# Patient Record
Sex: Female | Born: 1965 | Race: Black or African American | Hispanic: No | Marital: Married | Smoking: Never smoker
Health system: Southern US, Community
[De-identification: ages and names within clinical notes are randomized; demographics above are authoritative.]

## PROBLEM LIST (undated history)

## (undated) DIAGNOSIS — E785 Hyperlipidemia, unspecified: Secondary | ICD-10-CM

## (undated) DIAGNOSIS — R7303 Prediabetes: Secondary | ICD-10-CM

## (undated) DIAGNOSIS — R002 Palpitations: Secondary | ICD-10-CM

## (undated) DIAGNOSIS — R569 Unspecified convulsions: Secondary | ICD-10-CM

## (undated) DIAGNOSIS — C801 Malignant (primary) neoplasm, unspecified: Secondary | ICD-10-CM

## (undated) DIAGNOSIS — I10 Essential (primary) hypertension: Secondary | ICD-10-CM

## (undated) DIAGNOSIS — M2550 Pain in unspecified joint: Secondary | ICD-10-CM

## (undated) DIAGNOSIS — R609 Edema, unspecified: Secondary | ICD-10-CM

## (undated) DIAGNOSIS — I8390 Asymptomatic varicose veins of unspecified lower extremity: Secondary | ICD-10-CM

## (undated) HISTORY — DX: Malignant (primary) neoplasm, unspecified: C80.1

## (undated) HISTORY — DX: Hyperlipidemia, unspecified: E78.5

## (undated) HISTORY — PX: HYSTEROSALPINGOGRAM: SHX6581

## (undated) HISTORY — DX: Palpitations: R00.2

## (undated) HISTORY — DX: Pain in unspecified joint: M25.50

## (undated) HISTORY — DX: Edema, unspecified: R60.9

## (undated) HISTORY — DX: Asymptomatic varicose veins of unspecified lower extremity: I83.90

## (undated) HISTORY — DX: Prediabetes: R73.03

## (undated) HISTORY — DX: Essential (primary) hypertension: I10

---
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
## 1985-11-22 DIAGNOSIS — R569 Unspecified convulsions: Secondary | ICD-10-CM

## 1985-11-22 HISTORY — DX: Unspecified convulsions: R56.9

## 2000-02-18 ENCOUNTER — Other Ambulatory Visit: Admission: RE | Admit: 2000-02-18 | Discharge: 2000-02-18 | Payer: Self-pay | Admitting: Obstetrics and Gynecology

## 2001-09-15 ENCOUNTER — Other Ambulatory Visit: Admission: RE | Admit: 2001-09-15 | Discharge: 2001-09-15 | Payer: Self-pay | Admitting: Obstetrics and Gynecology

## 2002-11-23 ENCOUNTER — Other Ambulatory Visit: Admission: RE | Admit: 2002-11-23 | Discharge: 2002-11-23 | Payer: Self-pay | Admitting: Obstetrics and Gynecology

## 2004-01-03 ENCOUNTER — Other Ambulatory Visit: Admission: RE | Admit: 2004-01-03 | Discharge: 2004-01-03 | Payer: Self-pay | Admitting: Obstetrics and Gynecology

## 2005-01-12 ENCOUNTER — Other Ambulatory Visit: Admission: RE | Admit: 2005-01-12 | Discharge: 2005-01-12 | Payer: Self-pay | Admitting: Obstetrics and Gynecology

## 2006-01-14 ENCOUNTER — Other Ambulatory Visit: Admission: RE | Admit: 2006-01-14 | Discharge: 2006-01-14 | Payer: Self-pay | Admitting: Obstetrics and Gynecology

## 2008-06-27 ENCOUNTER — Encounter: Admission: RE | Admit: 2008-06-27 | Discharge: 2008-06-27 | Payer: Self-pay | Admitting: Obstetrics and Gynecology

## 2011-03-10 ENCOUNTER — Other Ambulatory Visit: Payer: Self-pay | Admitting: Obstetrics and Gynecology

## 2012-03-10 ENCOUNTER — Other Ambulatory Visit: Payer: Self-pay | Admitting: Obstetrics and Gynecology

## 2013-03-19 ENCOUNTER — Other Ambulatory Visit: Payer: Self-pay | Admitting: Obstetrics and Gynecology

## 2013-03-19 DIAGNOSIS — R928 Other abnormal and inconclusive findings on diagnostic imaging of breast: Secondary | ICD-10-CM

## 2013-03-29 ENCOUNTER — Ambulatory Visit
Admission: RE | Admit: 2013-03-29 | Discharge: 2013-03-29 | Disposition: A | Discharge status: Home / Self Care | Payer: PRIVATE HEALTH INSURANCE | Source: Ambulatory Visit | Attending: Obstetrics and Gynecology | Admitting: Obstetrics and Gynecology

## 2013-03-29 ENCOUNTER — Other Ambulatory Visit: Payer: Self-pay | Admitting: Obstetrics and Gynecology

## 2013-03-29 DIAGNOSIS — R928 Other abnormal and inconclusive findings on diagnostic imaging of breast: Secondary | ICD-10-CM

## 2013-04-11 ENCOUNTER — Other Ambulatory Visit: Payer: Self-pay | Admitting: Obstetrics and Gynecology

## 2013-04-11 ENCOUNTER — Ambulatory Visit
Admission: RE | Admit: 2013-04-11 | Discharge: 2013-04-11 | Disposition: A | Discharge status: Home / Self Care | Payer: PRIVATE HEALTH INSURANCE | Source: Ambulatory Visit | Attending: Obstetrics and Gynecology | Admitting: Obstetrics and Gynecology

## 2013-04-11 DIAGNOSIS — R928 Other abnormal and inconclusive findings on diagnostic imaging of breast: Secondary | ICD-10-CM

## 2013-04-11 HISTORY — PX: BREAST BIOPSY: SHX20

## 2015-08-06 HISTORY — PX: COLONOSCOPY: SHX174

## 2015-12-23 ENCOUNTER — Telehealth: Payer: Self-pay | Admitting: Vascular Surgery

## 2015-12-23 NOTE — Telephone Encounter (Signed)
Received message from Lindsey Burnett 09-02-1966 to call her at phone # [phone].   When I called the above number a gentleman answered stating she wasn't home. i asked for a 2ndary # and was given [phone]. I left her a message at this # to call Hinton Dyer at [phone] so that I could assist her. She is requesting an appointment with [facility].  Hinton Dyer

## 2016-01-26 ENCOUNTER — Encounter: Payer: Self-pay | Admitting: Vascular Surgery

## 2016-01-28 ENCOUNTER — Other Ambulatory Visit: Payer: Self-pay | Admitting: *Deleted

## 2016-01-28 DIAGNOSIS — I83811 Varicose veins of right lower extremities with pain: Secondary | ICD-10-CM

## 2016-01-29 ENCOUNTER — Encounter: Payer: Self-pay | Admitting: Vascular Surgery

## 2016-01-29 ENCOUNTER — Ambulatory Visit (HOSPITAL_COMMUNITY)
Admission: RE | Admit: 2016-01-29 | Discharge: 2016-01-29 | Disposition: A | Discharge status: Home / Self Care | Payer: BLUE CROSS/BLUE SHIELD | Source: Ambulatory Visit | Attending: Vascular Surgery | Admitting: Vascular Surgery

## 2016-01-29 ENCOUNTER — Ambulatory Visit (INDEPENDENT_AMBULATORY_CARE_PROVIDER_SITE_OTHER): Payer: BLUE CROSS/BLUE SHIELD | Admitting: Vascular Surgery

## 2016-01-29 VITALS — BP 147/76 | HR 76 | Temp 98.0°F | Resp 14 | Ht 66.5 in | Wt 204.0 lb

## 2016-01-29 DIAGNOSIS — R609 Edema, unspecified: Secondary | ICD-10-CM | POA: Diagnosis present

## 2016-01-29 DIAGNOSIS — I83811 Varicose veins of right lower extremities with pain: Secondary | ICD-10-CM
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
## 2016-01-29 NOTE — Progress Notes (Signed)
Filed Vitals:   01/29/16 1518 01/29/16 1521  BP: 141/87 147/76  Pulse: 77 76  Temp: 98 F (36.7 C)   Resp: 14   Height: 5' 6.5" (1.689 m)   Weight: 204 lb (92.534 kg)   SpO2: 100%

## 2016-04-29 ENCOUNTER — Encounter: Payer: Self-pay | Admitting: Vascular Surgery

## 2016-05-04 ENCOUNTER — Encounter: Payer: Self-pay | Admitting: Vascular Surgery

## 2016-05-04 ENCOUNTER — Ambulatory Visit (INDEPENDENT_AMBULATORY_CARE_PROVIDER_SITE_OTHER): Payer: BLUE CROSS/BLUE SHIELD | Admitting: Vascular Surgery

## 2016-05-04 VITALS — BP 156/84 | HR 69 | Temp 98.7°F | Resp 18 | Ht 66.5 in | Wt 205.8 lb

## 2016-05-04 DIAGNOSIS — I83819 Varicose veins of unspecified lower extremities with pain: Secondary | ICD-10-CM | POA: Insufficient documentation

## 2016-05-04 DIAGNOSIS — I83811 Varicose veins of right lower extremities with pain: Secondary | ICD-10-CM

## 2016-05-04 NOTE — Progress Notes (Signed)
Problems with Activities of Daily Living Secondary to Leg Pain  1. 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    2. 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       Failure of  Conservative Therapy:  1. Worn 20-30 mm Hg thigh high compression hose >3 months with no relief of symptoms.  2. Frequently elevates legs-no relief of symptoms  3. Taken Ibuprofen 600 Mg TID with no relief of symptoms.                                         Vascular and Vein Specialist of [facility]  Patient name: Lindsey Burnett MRN: [id number] DOB: [birth year] Sex: female  REASON FOR VISIT: Follow-up right leg painful venous varicosities  HPI: Lindsey Burnett is a 50 y.o. female here today for follow-up. She did see Dr. Oneida Alar in early March. She has been compliant with her compression and elevation. She continues to have a discomfort despite this. She has pain specifically over the large varicosities in her medial thigh and medial calf and also achy sensation after prolonged standing I did review her prior venous duplex. This shows no evidence of significant deep vein reflux. Does have reflux throughout her great saphenous vein with an enlarged diameter. Large varicosities arising from this  Past Medical History  Diagnosis Date  . Varicose veins     Family History  Problem Relation Age of Onset  . Stroke Mother   . Stroke Father   . Cancer Father     lung  . Diabetes Father   . Hypertension Father     SOCIAL HISTORY: Social History  Substance Use Topics  . Smoking status: Never Smoker   . Smokeless tobacco: Not on file  . Alcohol Use: Not on file    No Known Allergies  Current Outpatient Prescriptions  Medication Sig Dispense Refill  . aspirin 81 MG tablet Take 81 mg by mouth daily.    Mariane Baumgarten Calcium (STOOL SOFTENER PO) Take by mouth daily.    . Prenatal  Vit-Fe Fumarate-FA (PRENATAL VITAMIN PO) Take by mouth daily.    Marland Kitchen BLACK COHOSH PO Take by mouth daily. Reported on 05/04/2016    . IRON PO Take by mouth. Reported on 05/04/2016     No current facility-administered medications for this visit.    REVIEW OF SYSTEMS:  [X]  denotes positive finding, [ ]  denotes negative finding Cardiac  Comments:  Chest pain or chest pressure:    Shortness of breath upon exertion:    Short of breath when lying flat:    Irregular heart rhythm:        Vascular    Pain in calf, thigh, or hip brought on by ambulation:    Pain in feet at night that wakes you up from your sleep:     Blood clot in your veins:    Leg swelling:         Pulmonary    Oxygen at home:    Productive cough:     Wheezing:         Neurologic    Sudden weakness in arms or legs:     Sudden numbness in arms or legs:     Sudden onset  of difficulty speaking or slurred speech:    Temporary loss of vision in one eye:     Problems with dizziness:         Gastrointestinal    Blood in stool:     Vomited blood:         Genitourinary    Burning when urinating:     Blood in urine:        Psychiatric    Major depression:         Hematologic    Bleeding problems:    Problems with blood clotting too easily:        Skin    Rashes or ulcers:        Constitutional    Fever or chills:      PHYSICAL EXAM: Filed Vitals:   05/04/16 1523 05/04/16 1531  BP: 155/84 156/84  Pulse: 69   Temp: 98.7 F (37.1 C)   TempSrc: Oral   Resp: 18   Height: 5' 6.5" (1.689 m)   Weight: 205 lb 12.8 oz (93.35 kg)   SpO2: 100%     GENERAL: The patient is a well-nourished female, in no acute distress. The vital signs are documented above. VASCULAR: 2+ dorsalis pedis pulses bilaterally PULMONARY: There is good air exchange  MUSCULOSKELETAL: There are no major deformities or cyanosis. NEUROLOGIC: No focal weakness or paresthesias are detected. SKIN: There are no ulcers or rashes  noted. PSYCHIATRIC: The patient has a normal affect. Markedly dilated varicosities throughout her medial thigh and medial calf   MEDICAL ISSUES: Healed conservative treatment painful varicosities right leg. I have discussed the option of laser ablation of her great saphenous vein and stab phlebectomy of large tributary varicosities. Explain the outpatient procedure under local. Explain the very slight risk of DVT associated with this. She understands and wished to proceed as soon as possible    Rosetta Posner, MD FACS Vascular and Vein Specialists of [facility] Tel [phone] Pager [phone]

## 2016-05-06 ENCOUNTER — Other Ambulatory Visit: Payer: Self-pay | Admitting: *Deleted

## 2016-05-06 DIAGNOSIS — I83811 Varicose veins of right lower extremities with pain: Secondary | ICD-10-CM

## 2016-06-04 ENCOUNTER — Encounter: Payer: Self-pay | Admitting: Vascular Surgery

## 2016-06-10 ENCOUNTER — Encounter: Payer: Self-pay | Admitting: Vascular Surgery

## 2016-06-10 ENCOUNTER — Ambulatory Visit (INDEPENDENT_AMBULATORY_CARE_PROVIDER_SITE_OTHER): Payer: BLUE CROSS/BLUE SHIELD | Admitting: Vascular Surgery

## 2016-06-10 VITALS — BP 150/94 | HR 68 | Temp 98.1°F | Resp 18 | Ht 66.5 in | Wt 205.0 lb

## 2016-06-10 DIAGNOSIS — I83811 Varicose veins of right lower extremities with pain: Secondary | ICD-10-CM

## 2016-06-10 HISTORY — PX: ENDOVENOUS ABLATION SAPHENOUS VEIN W/ LASER: SUR449

## 2016-06-10 NOTE — Progress Notes (Signed)
     Laser Ablation Procedure    Date: 06/10/2016   [place] DOB:06-Nov-1966  Consent signed: Yes    Surgeon:  Dr. Sherren Mocha Xavian Hardcastle  Procedure: Laser Ablation: right Greater Saphenous Vein  BP 150/94 mmHg  Pulse 68  Temp(Src) 98.1 F (36.7 C) (Oral)  Resp 18  Ht 5' 6.5" (1.689 m)  Wt 205 lb (92.987 kg)  BMI 32.60 kg/m2  SpO2 100%  Tumescent Anesthesia: 475 cc 0.9% NaCl with 50 cc Lidocaine HCL with 1% Epi and 15 cc 8.4% NaHCO3  Local Anesthesia: 1 cc Lidocaine HCL and NaHCO3 (ratio 2:1)  15 watts continuous mode        Total energy: 1338 Joules   Total time: 1:29    Stab Phlebectomy: 10-20 Sites: Thigh and Calf  Right leg  Patient tolerated procedure well Description of Procedure:  After marking the course of the secondary varicosities, the patient was placed on the operating table in the supine position, and the right leg was prepped and draped in sterile fashion.   Local anesthetic was administered and under ultrasound guidance the saphenous vein was accessed with a micro needle and guide wire; then the mirco puncture sheath was placed.  A guide wire was inserted saphenofemoral junction , followed by a 5 french sheath.  The position of the sheath and then the laser fiber below the junction was confirmed using the ultrasound.  Tumescent anesthesia was administered along the course of the saphenous vein using ultrasound guidance. The patient was placed in Trendelenburg position and protective laser glasses were placed on patient and staff, and the laser was fired at 15 watts continuous mode advancing 1-67mm/second for a total of 1338 joules.   For stab phlebectomies, local anesthetic was administered at the previously marked varicosities, and tumescent anesthesia was administered around the vessels.  Ten to 20 stab wounds were made using the tip of an 11 blade. And using the vein hook, the phlebectomies were performed using a hemostat to avulse the varicosities.  Adequate hemostasis was  achieved.     Steri strips were applied to the stab wounds and ABD pads and thigh high compression stockings were applied.  Ace wrap bandages were applied over the phlebectomy sites and at the top of the saphenofemoral junction. Blood loss was less than 15 cc.  The patient ambulated out of the operating room having tolerated the procedure well.  Uneventful ablation from mid calf to mid thigh. She had reflux in her great saphenous vein at this level. Also had phlebectomy of large varicosities in her medial calf and medial thigh. The medial thigh varicosities arose off the short segment of anterior accessory branch in her proximal thigh. These varicosities these were successfully treated with stab phlebectomy. Will follow-up in one week

## 2016-06-10 NOTE — Progress Notes (Signed)
Filed Vitals:   06/10/16 0830 06/10/16 0834  BP: 154/89 150/94  Pulse: 68   Temp: 98.1 F (36.7 C)   TempSrc: Oral   Resp: 18   Height: 5' 6.5" (1.689 m)   Weight: 205 lb (92.987 kg)   SpO2: 100%

## 2016-06-11 ENCOUNTER — Telehealth: Payer: Self-pay | Admitting: *Deleted

## 2016-06-11 NOTE — Telephone Encounter (Signed)
    06/11/2016  Time: 10:58 AM   Patient Name: Lindsey Burnett  Patient of: T.F. Early  Procedure:Laser Ablation and stab phlebectomy 10-20 incisions right leg  right  06-10-2016    Reached patient at home and checked  Her status  Yes    Comments/Actions Taken: Reviewed all post procedural instructions with [name] and reminded her of post LA duplex and VV follow up appointment on 06-17-2016.  [name] states she has had no heavy bleeding and no breakthrough bleeding through the compression dressing.  [name] states no right thigh pain but states she had mild discomfort "behind my right knee" and "slight swelling of my right ankle."  Advised [name] to unwrap Ace bandage and smooth out compression hose behind her right knee and re wrap with Ace bandage.  Advised her to keep right leg elevated and call [facility]VS if swelling increased in right ankle or if she experienced right foot and ankle swelling and pain or if she had difficulty putting on right shoe.  [name] verbalized understanding.       @SIGNATURE @

## 2016-06-16 ENCOUNTER — Encounter: Payer: Self-pay | Admitting: Vascular Surgery

## 2016-06-17 ENCOUNTER — Ambulatory Visit (INDEPENDENT_AMBULATORY_CARE_PROVIDER_SITE_OTHER): Payer: Self-pay | Admitting: Vascular Surgery

## 2016-06-17 ENCOUNTER — Encounter: Payer: Self-pay | Admitting: Vascular Surgery

## 2016-06-17 ENCOUNTER — Ambulatory Visit (HOSPITAL_COMMUNITY)
Admission: RE | Admit: 2016-06-17 | Discharge: 2016-06-17 | Disposition: A | Discharge status: Home / Self Care | Payer: BLUE CROSS/BLUE SHIELD | Source: Ambulatory Visit | Attending: Vascular Surgery | Admitting: Vascular Surgery

## 2016-06-17 VITALS — BP 163/91 | HR 72 | Temp 97.6°F | Resp 16 | Ht 66.5 in | Wt 204.0 lb

## 2016-06-17 DIAGNOSIS — Z9889 Other specified postprocedural states: Secondary | ICD-10-CM | POA: Insufficient documentation

## 2016-06-17 DIAGNOSIS — I83811 Varicose veins of right lower extremities with pain: Secondary | ICD-10-CM | POA: Insufficient documentation

## 2016-06-17 NOTE — Progress Notes (Signed)
Vitals:   06/17/16 0909  BP: (!) 151/87  Pulse: 72  Resp: 16  Temp: 97.6 F (36.4 C)  SpO2: 100%  Weight: 204 lb (92.5 kg)  Height: 5' 6.5" (1.689 m)                                       Patient name: Lindsey Burnett MRN: [id number] DOB: 1966-05-23 Sex: female  REASON FOR VISIT: One-week follow-up after right laser ablation of great saphenous vein and stab phlebectomy of tributary varicosities  HPI: Lindsey Burnett is a 50 y.o. female here today for follow-up. She had minimal discomfort associated with procedure and has been compliant with her compression garments.  Current Outpatient Prescriptions  Medication Sig Dispense Refill  . aspirin 81 MG tablet Take 81 mg by mouth daily. Reported on 06/10/2016    . Docusate Calcium (STOOL SOFTENER PO) Take by mouth daily.    . Prenatal Vit-Fe Fumarate-FA (PRENATAL VITAMIN PO) Take by mouth daily.    Marland Kitchen BLACK COHOSH PO Take by mouth daily. Reported on 06/10/2016    . IRON PO Take by mouth. Reported on 06/10/2016     No current facility-administered medications for this visit.      PHYSICAL EXAM: Vitals:   06/17/16 0909 06/17/16 0914  BP: (!) 151/87 (!) 163/91  Pulse: 72 72  Resp: 16   Temp: 97.6 F (36.4 C)   SpO2: 100%   Weight: 204 lb (92.5 kg)   Height: 5' 6.5" (1.689 m)     GENERAL: The patient is a well-nourished female, in no acute distress. The vital signs are documented above. Healing stab phlebectomy sites with typical thickening in the ablation site. No skin irritation.  Right leg duplex shows closure of her saphenous vein from proximal calf to mid thigh where the saphenous vein was atretic proximal to this. No evidence of DVT  MEDICAL ISSUES: Excellent early result from laser ablation and stab phlebectomy of painful right leg venous hypertension. We'll continue her compression garments one additional week. Will see [place] on an as-needed basis   Rosetta Posner, MD [facility] Vascular and Vein Specialists of [facility] Tel [phone] Pager [phone]

## 2016-06-30 DIAGNOSIS — Z1231 Encounter for screening mammogram for malignant neoplasm of breast: Secondary | ICD-10-CM | POA: Diagnosis not present

## 2016-06-30 DIAGNOSIS — Z6831 Body mass index (BMI) 31.0-31.9, adult: Secondary | ICD-10-CM | POA: Diagnosis not present

## 2016-06-30 DIAGNOSIS — Z01419 Encounter for gynecological examination (general) (routine) without abnormal findings: Secondary | ICD-10-CM | POA: Diagnosis not present

## 2016-12-16 DIAGNOSIS — R002 Palpitations: Secondary | ICD-10-CM | POA: Diagnosis not present

## 2016-12-16 DIAGNOSIS — Z6828 Body mass index (BMI) 28.0-28.9, adult: Secondary | ICD-10-CM | POA: Diagnosis not present

## 2016-12-20 DIAGNOSIS — Z23 Encounter for immunization: Secondary | ICD-10-CM | POA: Diagnosis not present

## 2016-12-23 DIAGNOSIS — R310 Gross hematuria: Secondary | ICD-10-CM | POA: Diagnosis not present

## 2016-12-23 DIAGNOSIS — Z20828 Contact with and (suspected) exposure to other viral communicable diseases: Secondary | ICD-10-CM | POA: Diagnosis not present

## 2016-12-27 DIAGNOSIS — N390 Urinary tract infection, site not specified: Secondary | ICD-10-CM | POA: Diagnosis not present

## 2016-12-27 DIAGNOSIS — R300 Dysuria: Secondary | ICD-10-CM | POA: Diagnosis not present

## 2017-02-24 DIAGNOSIS — N390 Urinary tract infection, site not specified: Secondary | ICD-10-CM | POA: Diagnosis not present

## 2017-02-24 DIAGNOSIS — R319 Hematuria, unspecified: Secondary | ICD-10-CM | POA: Diagnosis not present

## 2017-02-24 DIAGNOSIS — Z6830 Body mass index (BMI) 30.0-30.9, adult: Secondary | ICD-10-CM | POA: Diagnosis not present

## 2017-03-03 DIAGNOSIS — R319 Hematuria, unspecified: Secondary | ICD-10-CM | POA: Diagnosis not present

## 2017-03-03 DIAGNOSIS — E785 Hyperlipidemia, unspecified: Secondary | ICD-10-CM | POA: Diagnosis not present

## 2017-03-09 DIAGNOSIS — Z6830 Body mass index (BMI) 30.0-30.9, adult: Secondary | ICD-10-CM | POA: Diagnosis not present

## 2017-03-09 DIAGNOSIS — R319 Hematuria, unspecified: Secondary | ICD-10-CM | POA: Diagnosis not present

## 2017-03-09 DIAGNOSIS — E785 Hyperlipidemia, unspecified: Secondary | ICD-10-CM | POA: Diagnosis not present

## 2017-04-15 DIAGNOSIS — N3090 Cystitis, unspecified without hematuria: Secondary | ICD-10-CM | POA: Diagnosis not present

## 2017-04-21 DIAGNOSIS — N951 Menopausal and female climacteric states: Secondary | ICD-10-CM | POA: Diagnosis not present

## 2017-04-21 DIAGNOSIS — Z6830 Body mass index (BMI) 30.0-30.9, adult: Secondary | ICD-10-CM | POA: Diagnosis not present

## 2017-07-13 DIAGNOSIS — Z124 Encounter for screening for malignant neoplasm of cervix: Secondary | ICD-10-CM | POA: Diagnosis not present

## 2017-07-13 DIAGNOSIS — Z01419 Encounter for gynecological examination (general) (routine) without abnormal findings: Secondary | ICD-10-CM | POA: Diagnosis not present

## 2017-07-13 DIAGNOSIS — Z1231 Encounter for screening mammogram for malignant neoplasm of breast: Secondary | ICD-10-CM | POA: Diagnosis not present

## 2017-07-13 DIAGNOSIS — Z6830 Body mass index (BMI) 30.0-30.9, adult: Secondary | ICD-10-CM | POA: Diagnosis not present

## 2017-08-03 DIAGNOSIS — Z6829 Body mass index (BMI) 29.0-29.9, adult: Secondary | ICD-10-CM | POA: Diagnosis not present

## 2017-08-03 DIAGNOSIS — E663 Overweight: Secondary | ICD-10-CM | POA: Diagnosis not present

## 2017-08-03 DIAGNOSIS — E785 Hyperlipidemia, unspecified: Secondary | ICD-10-CM | POA: Diagnosis not present

## 2017-08-03 DIAGNOSIS — Z23 Encounter for immunization: Secondary | ICD-10-CM | POA: Diagnosis not present

## 2017-08-17 DIAGNOSIS — Z6829 Body mass index (BMI) 29.0-29.9, adult: Secondary | ICD-10-CM | POA: Diagnosis not present

## 2017-08-17 DIAGNOSIS — R309 Painful micturition, unspecified: Secondary | ICD-10-CM | POA: Diagnosis not present

## 2017-08-25 DIAGNOSIS — N939 Abnormal uterine and vaginal bleeding, unspecified: Secondary | ICD-10-CM | POA: Diagnosis not present

## 2017-08-26 DIAGNOSIS — R339 Retention of urine, unspecified: Secondary | ICD-10-CM | POA: Diagnosis not present

## 2017-08-26 DIAGNOSIS — Z780 Asymptomatic menopausal state: Secondary | ICD-10-CM | POA: Diagnosis not present

## 2017-08-26 DIAGNOSIS — N390 Urinary tract infection, site not specified: Secondary | ICD-10-CM | POA: Diagnosis not present

## 2017-08-26 DIAGNOSIS — N813 Complete uterovaginal prolapse: Secondary | ICD-10-CM | POA: Diagnosis not present

## 2017-09-01 DIAGNOSIS — Z6830 Body mass index (BMI) 30.0-30.9, adult: Secondary | ICD-10-CM | POA: Diagnosis not present

## 2017-09-01 DIAGNOSIS — N939 Abnormal uterine and vaginal bleeding, unspecified: Secondary | ICD-10-CM | POA: Diagnosis not present

## 2018-03-17 DIAGNOSIS — R05 Cough: Secondary | ICD-10-CM | POA: Diagnosis not present

## 2018-03-17 DIAGNOSIS — J309 Allergic rhinitis, unspecified: Secondary | ICD-10-CM | POA: Diagnosis not present

## 2018-05-04 DIAGNOSIS — N950 Postmenopausal bleeding: Secondary | ICD-10-CM | POA: Diagnosis not present

## 2018-05-04 DIAGNOSIS — Z6831 Body mass index (BMI) 31.0-31.9, adult: Secondary | ICD-10-CM | POA: Diagnosis not present

## 2018-05-12 DIAGNOSIS — R300 Dysuria: Secondary | ICD-10-CM | POA: Diagnosis not present

## 2018-05-12 DIAGNOSIS — N390 Urinary tract infection, site not specified: Secondary | ICD-10-CM | POA: Diagnosis not present

## 2018-05-12 DIAGNOSIS — Z1331 Encounter for screening for depression: Secondary | ICD-10-CM | POA: Diagnosis not present

## 2018-05-12 DIAGNOSIS — E663 Overweight: Secondary | ICD-10-CM | POA: Diagnosis not present

## 2018-05-12 DIAGNOSIS — Z6829 Body mass index (BMI) 29.0-29.9, adult: Secondary | ICD-10-CM | POA: Diagnosis not present

## 2018-07-26 DIAGNOSIS — Z01419 Encounter for gynecological examination (general) (routine) without abnormal findings: Secondary | ICD-10-CM | POA: Diagnosis not present

## 2018-07-26 DIAGNOSIS — Z1231 Encounter for screening mammogram for malignant neoplasm of breast: Secondary | ICD-10-CM | POA: Diagnosis not present

## 2018-07-26 DIAGNOSIS — Z6832 Body mass index (BMI) 32.0-32.9, adult: Secondary | ICD-10-CM | POA: Diagnosis not present

## 2018-09-05 DIAGNOSIS — Z6832 Body mass index (BMI) 32.0-32.9, adult: Secondary | ICD-10-CM | POA: Diagnosis not present

## 2018-09-05 DIAGNOSIS — N814 Uterovaginal prolapse, unspecified: Secondary | ICD-10-CM | POA: Diagnosis not present

## 2018-10-02 DIAGNOSIS — E669 Obesity, unspecified: Secondary | ICD-10-CM | POA: Diagnosis not present

## 2018-10-02 DIAGNOSIS — R300 Dysuria: Secondary | ICD-10-CM | POA: Diagnosis not present

## 2018-10-02 DIAGNOSIS — N390 Urinary tract infection, site not specified: Secondary | ICD-10-CM | POA: Diagnosis not present

## 2018-10-02 DIAGNOSIS — Z6832 Body mass index (BMI) 32.0-32.9, adult: Secondary | ICD-10-CM | POA: Diagnosis not present

## 2018-10-09 DIAGNOSIS — Z6832 Body mass index (BMI) 32.0-32.9, adult: Secondary | ICD-10-CM | POA: Diagnosis not present

## 2018-10-09 DIAGNOSIS — M778 Other enthesopathies, not elsewhere classified: Secondary | ICD-10-CM | POA: Diagnosis not present

## 2018-10-09 DIAGNOSIS — E669 Obesity, unspecified: Secondary | ICD-10-CM | POA: Diagnosis not present

## 2019-08-30 ENCOUNTER — Other Ambulatory Visit: Payer: Self-pay | Admitting: Obstetrics and Gynecology

## 2019-08-30 DIAGNOSIS — N644 Mastodynia: Secondary | ICD-10-CM

## 2019-09-12 ENCOUNTER — Other Ambulatory Visit (HOSPITAL_COMMUNITY): Payer: Self-pay | Admitting: *Deleted

## 2019-09-12 DIAGNOSIS — N6320 Unspecified lump in the left breast, unspecified quadrant: Secondary | ICD-10-CM

## 2019-09-12 DIAGNOSIS — N644 Mastodynia: Secondary | ICD-10-CM

## 2019-09-18 ENCOUNTER — Ambulatory Visit (HOSPITAL_COMMUNITY)
Admission: RE | Admit: 2019-09-18 | Discharge: 2019-09-18 | Disposition: A | Discharge status: Home / Self Care | Payer: Self-pay | Source: Ambulatory Visit | Attending: Obstetrics and Gynecology | Admitting: Obstetrics and Gynecology

## 2019-09-18 ENCOUNTER — Encounter (HOSPITAL_COMMUNITY): Payer: Self-pay

## 2019-09-18 ENCOUNTER — Ambulatory Visit
Admission: RE | Admit: 2019-09-18 | Discharge: 2019-09-18 | Disposition: A | Discharge status: Home / Self Care | Payer: BLUE CROSS/BLUE SHIELD | Source: Ambulatory Visit | Attending: Obstetrics and Gynecology | Admitting: Obstetrics and Gynecology

## 2019-09-18 ENCOUNTER — Ambulatory Visit
Admission: RE | Admit: 2019-09-18 | Discharge: 2019-09-18 | Disposition: A | Discharge status: Home / Self Care | Payer: No Typology Code available for payment source | Source: Ambulatory Visit | Attending: Obstetrics and Gynecology | Admitting: Obstetrics and Gynecology

## 2019-09-18 ENCOUNTER — Other Ambulatory Visit: Payer: Self-pay

## 2019-09-18 DIAGNOSIS — N644 Mastodynia: Secondary | ICD-10-CM

## 2019-09-18 DIAGNOSIS — N6320 Unspecified lump in the left breast, unspecified quadrant: Secondary | ICD-10-CM

## 2019-09-18 DIAGNOSIS — Z1239 Encounter for other screening for malignant neoplasm of breast: Secondary | ICD-10-CM

## 2019-09-18 NOTE — Progress Notes (Signed)
Complaints of left outer and axillary breast pain x 4 months that comes and goes. Patient rates the pain at a 6 out of 10. Patient stated she was having right outer breast and axillary pain that resolved two weeks ago.  Pap Smear: Pap smear not completed today. Last Pap smear was in September 2019 at [facility] and normal per patient. Per patient has no history of an abnormal Pap smear. No Pap smear results are in Epic.  Physical exam: Breasts Breasts symmetrical. No skin abnormalities bilateral breasts. No nipple retraction bilateral breasts. No nipple discharge bilateral breasts. No lymphadenopathy. No lumps palpated bilateral breasts. Complaints of bilateral outer breast pain on exam. Referred patient to the [facility] for a diagnostic mammogram. Appointment scheduled for Tuesday, September 18, 2019 at 1450.        Pelvic/Bimanual No Pap smear completed today since last Pap smear was in September 2019 per patient. Pap smear not indicated per BCCCP guidelines.   Smoking History: Patient has never smoked.  Patient Navigation: Patient education provided. Access to services provided for patient through [facility] program.   Colorectal Cancer Screening: Per patient had a colonoscopy completed 6 years ago. No complaints today.   Breast and Cervical Cancer Risk Assessment: Patient has a family history of a sister and paternal aunt having breast cancer. Patient has no known genetic mutations or history of radiation treatment to the chest before age 15. Patient has no history of cervical dysplasia, immunocompromised, or DES exposure in-utero.  Risk Assessment    Risk Scores      09/18/2019   Last edited by: Loletta Parish, RN   5-year risk: 2.2 %   Lifetime risk: 12.2 %

## 2019-09-18 NOTE — Patient Instructions (Signed)
Explained breast self awareness Lindsey Burnett. Patient did not need a Pap smear today due to last Pap smear was in September 2019 per patient. Let her know BCCCP will cover Pap smears every 3 years unless has a history of abnormal Pap smears. Referred patient to the [facility] for a diagnostic mammogram. Appointment scheduled for Tuesday, September 18, 2019 at 1450. Patient aware of appointment and will be there. [name] verbalized understanding.  Mujahid Jalomo, Arvil Chaco, RN 9:52 AM

## 2019-10-10 ENCOUNTER — Ambulatory Visit: Payer: Self-pay

## 2019-10-17 ENCOUNTER — Inpatient Hospital Stay: Payer: Self-pay | Attending: Obstetrics and Gynecology | Admitting: *Deleted

## 2019-10-17 ENCOUNTER — Other Ambulatory Visit: Payer: Self-pay | Admitting: Obstetrics and Gynecology

## 2019-10-17 ENCOUNTER — Other Ambulatory Visit: Payer: Self-pay

## 2019-10-17 VITALS — BP 159/94 | Temp 97.3°F | Ht 67.5 in | Wt 208.0 lb

## 2019-10-17 DIAGNOSIS — Z0000 Encounter for general adult medical examination without abnormal findings: Secondary | ICD-10-CM

## 2019-10-17 NOTE — Progress Notes (Signed)
[facility] initial screening   Clinical Measurement:  Height: 67.5 in Weight: 208 lb  Blood Pressure: 172/99  Blood Pressure #2: 159/94 Fasting Labs Drawn Today, will review with patient when they result.   Medical History:  Patient states that she does not have a history of high cholesterol, high blood pressure or diabetes.  Medications:  Patient states that she does not take medication to lower cholesterol, blood pressure or blood sugar.  Patient does not take an aspirin a day to help prevent a heart attack or stroke.    Blood pressure, self measurement: Patient states that she does not measure blood pressure from home and has not been told to do so by a healthcare provider.   Nutrition: Patient states that on average she eats 1cups of fruit and 0 cups of vegetables per day. Patient states that she does not eat fish at least 2 times per week. Patient eats less than half servings of whole grains. Patient does not drink less than 36 ounces of beverages with added sugar weekly. Patient is currently watching sodium or salt intake. In the past 7 days patient has not had any drinks containing alcohol. On average patient does not drink any drinks containing alcohol.      Physical activity:  Patient states that she gets 0 minutes of moderate and 0 minutes of vigorous physical activity each week.  Smoking status:  Patient states that she has never smoked tobacco.   Quality of life:  Over the past 2 weeks patient states that she has not had any days where she has little interest or pleasure in doing things and 0 days where she has felt down, depressed or hopeless.    Risk reduction and counseling:    Health Coaching: Spoke with patient about add more fruits and vegetables in diet. Gave suggestions for heart healthy fish that patient can try like salmon, tuna or mackerel. Gave ideas for different whole grains that patient can incorporate into diet like brown rice, whole wheat pasta, whole wheat bread,  oatmeal and whole grain cereals. Encouraged patient to reduce the amount of beverages with added sugars that is consumed. Patient currently drinks around 2 sodas a week and 12 cups of juice. Encouraged patient to look for juices with reduced sugar or no sugar added. Encouraged patient to continue watching the amount of salt that she consumes since her BP has been elevated recently. Also encouraged patient to try and start exercising daily if possible. Encouraged patient to start out walking a for a few minutes every day and increasing the time until she gets to at least 20 minutes of walking daily.    Navigation:  I will notify patient of lab results.  Patient is aware of 2 more health coaching sessions and a follow up. Will call patient with follow-up appointment information for Internal Medicine once appointment has been scheduled.   Time: 20 minutes

## 2019-10-18 LAB — LIPID PANEL W/O CHOL/HDL RATIO
Cholesterol, Total: 171 mg/dL (ref 100–199)
HDL: 52 mg/dL (ref >39)
LDL Chol Calc (NIH): 110 mg/dL — ABNORMAL HIGH (ref 0–99)
Triglycerides: 42 mg/dL (ref 0–149)
VLDL Cholesterol Cal: 9 mg/dL (ref 5–40)

## 2019-10-18 LAB — HGB A1C W/O EAG: Hgb A1c MFr Bld: 5.4 % (ref 4.8–5.6)

## 2019-10-18 LAB — GLUCOSE, RANDOM: Glucose: 92 mg/dL (ref 65–99)

## 2019-10-22 ENCOUNTER — Telehealth: Payer: Self-pay

## 2019-10-22 NOTE — Telephone Encounter (Signed)
Health coaching 2     Labs-171 cholesterol , 110 LDL cholesterol , 42 triglycerides , 52 HDL cholesterol , 5.4 hemoglobin A1C , 92 mean plasma glucose  Patient understands and is aware of her lab results.   Goals-  Discussed lab results with patient and answered any questions patient had regarding results.  Reduce the amount of fried and fatty foods consumed. Reduce the amount of red meat and full fat dairy products. Add in more whole grains and heart healthy fish. Add more fruits and vegetables in diet. Start exercising for at least 20 minutes a day.   Navigation:  Patient is aware of 1 more health coaching sessions and a follow up. Will call patient with follow-up appointment information once appointment is scheduled with Internal Medicine for elevated BP.  Time- 10 minutes

## 2019-11-01 ENCOUNTER — Other Ambulatory Visit: Payer: Self-pay

## 2019-11-01 ENCOUNTER — Ambulatory Visit (HOSPITAL_COMMUNITY)
Admission: RE | Admit: 2019-11-01 | Discharge: 2019-11-01 | Disposition: A | Discharge status: Home / Self Care | Payer: PRIVATE HEALTH INSURANCE | Source: Ambulatory Visit | Attending: Internal Medicine | Admitting: Internal Medicine

## 2019-11-01 ENCOUNTER — Ambulatory Visit (INDEPENDENT_AMBULATORY_CARE_PROVIDER_SITE_OTHER): Payer: Self-pay | Admitting: Internal Medicine

## 2019-11-01 VITALS — BP 156/97 | HR 75 | Temp 98.4°F | Ht 67.5 in | Wt 210.7 lb

## 2019-11-01 DIAGNOSIS — Z0000 Encounter for general adult medical examination without abnormal findings: Secondary | ICD-10-CM | POA: Insufficient documentation

## 2019-11-01 DIAGNOSIS — R002 Palpitations: Secondary | ICD-10-CM

## 2019-11-01 DIAGNOSIS — I10 Essential (primary) hypertension: Secondary | ICD-10-CM | POA: Insufficient documentation

## 2019-11-01 DIAGNOSIS — Z23 Encounter for immunization: Secondary | ICD-10-CM

## 2019-11-01 DIAGNOSIS — I8390 Asymptomatic varicose veins of unspecified lower extremity: Secondary | ICD-10-CM

## 2019-11-01 DIAGNOSIS — G40909 Epilepsy, unspecified, not intractable, without status epilepticus: Secondary | ICD-10-CM

## 2019-11-01 MED ORDER — AMLODIPINE BESYLATE 5 MG PO TABS: 5.0000 mg | ORAL_TABLET | Freq: Every day | ORAL | 0 refills | Status: DC

## 2019-11-01 MED FILL — AMLODIPINE BESYLATE 5 MG TA: 5 | 30 days supply | Qty: 30 | Fill #0

## 2019-11-01 NOTE — Assessment & Plan Note (Signed)
Patient received influenza vaccination.

## 2019-11-01 NOTE — Patient Instructions (Signed)
It was a pleasure to see you today Lindsey Burnett. Please make the following changes:  During this visit you have been diagnosed with hypertension due to your high blood pressure. I have started you on amlodipine 5mg  daily. Please follow up in 1 month for a blood pressure follow up. Please make sure to avoid salty foods, work on weight reduction. You also received a flu shot during this visit   If you have any questions or concerns, please call our clinic at [phone] between 9am-5pm and after hours call [phone] and ask for the internal medicine resident on call. If you feel you are having a medical emergency please call 911.   Thank you, we look forward to help you remain healthy!  Lars Mage, MD Internal Medicine PGY3   Amlodipine; Aliskiren; Hydrochlorothiazide, HCTZ oral tablets What is this medicine? AMLODIPINE; ALISKIREN; HYDROCHLOROTHIAZIDE (am LOE di peen; a lis KYE ren; hye droe klor oh THYE a zide) is a combination of a calcium channel blocker, a renin inhibitor and a diuretic. It is used to treat high blood pressure. This medicine may be used for other purposes; ask your health care provider or pharmacist if you have questions. COMMON BRAND NAME(S): Amturnide What should I tell my health care provider before I take this medicine? They need to know if you have any of these conditions:  dehydration  diabetes  heart disease  kidney disease  liver disease  lupus  an unusual or allergic reaction to amlodipine; aliskiren; hydrochlorothiazide, HCTZ, sulfa drugs, other medicines, foods, dyes, or preservatives  pregnant or trying to get pregnant  breast-feeding How should I use this medicine? Take this medicine by mouth with a glass of water. Follow the directions on the prescription label. You can take this medicine with or without food. However, you should always take it the same way each time. Take your medicine at regular intervals. Do not take it more often than  directed. Do not stop taking except on your doctor's advice. Talk to your pediatrician regarding the use of this medicine in children. Special care may be needed. Overdosage: If you think you have taken too much of this medicine contact a poison control center or emergency room at once. NOTE: This medicine is only for you. Do not share this medicine with others. What if I miss a dose? If you miss a dose, take it as soon as you can. If it is almost time for your next dose, take only that dose. Do not take double or extra doses. What may interact with this medicine?  alcohol  atorvastatin  barbiturates  certain medicines for fungal infections like ketoconazole and itraconazole  cholestyramine  colestipol  cyclosporine  furosemide  lithium  medicines for blood pressure  medicines for diabetes  medicines that relax muscles for surgery  narcotic medicines for pain  NSAIDs, medicines for pain and inflammation, like ibuprofen or naproxen  potassium supplements  steroid medicines like prednisone or cortisone This list may not describe all possible interactions. Give your health care provider a list of all the medicines, herbs, non-prescription drugs, or dietary supplements you use. Also tell them if you smoke, drink alcohol, or use illegal drugs. Some items may interact with your medicine. What should I watch for while using this medicine? Visit your doctor for regular check ups. Check your blood pressure as directed. Ask your doctor what your blood pressure should be and when you should contact him or her. Women should inform their doctor if they wish  to become pregnant or think they might be pregnant. There is a potential for serious side effects to an unborn child. Talk to your health care professional or pharmacist for more information. You may need to be on a special diet while taking this medicine. Ask your doctor. Check with your doctor or health care professional if you  get an attack of severe diarrhea, nausea and vomiting, or if you sweat a lot. The loss of too much body fluid can make it dangerous for you to take this medicine. You may get drowsy or dizzy. Do not drive, use machinery, or do anything that needs mental alertness until you know how this medicine affects you. Do not stand or sit up quickly, especially if you are an older patient. This reduces the risk of dizzy or fainting spells. Alcohol may interfere with the effect of this medicine. Avoid alcoholic drinks. This medicine may affect your blood sugar level. If you have diabetes, check with your doctor or health care professional before changing the dose of your diabetic medicine. Do not take this medicine if you have diabetes and are taking a medicine called an angiotensin-receptor-blocker (ARB) or angiotensin-converting-enzyme-inhibitor (ACE inhibitor). Talk to your doctor or health care professional for more information. This medicine can make you more sensitive to the sun. Keep out of the sun. If you cannot avoid being in the sun, wear protective clothing and use sunscreen. Do not use sun lamps or tanning beds/booths. What side effects may I notice from receiving this medicine? Side effects that you should report to your doctor or health care professional as soon as possible:  allergic reactions like skin rash or hives, swelling of the hands, feet, face, lips, throat, or tongue  breathing problems  changes in vision  chest pain or chest tightness  eye pain  feeling faint or lightheaded, falls  low blood pressure  muscle pain  pain or difficulty passing urine  redness, blistering, peeling or loosening of the skin, including inside the mouth Side effects that usually do not require medical attention (report to your doctor or health care professional if they continue or are bothersome):  change in sex drive or performance  diarrhea  flu-like symptoms  headache  upset stomach This  list may not describe all possible side effects. Call your doctor for medical advice about side effects. You may report side effects to FDA at 1-800-FDA-1088. Where should I keep my medicine? Keep out of the reach of children. Store at room temperature between 15 and 30 degrees C (59 and 86 degrees F). Protect from moisture. Throw away any unused medicine after the expiration date. NOTE: This sheet is a summary. It may not cover all possible information. If you have questions about this medicine, talk to your doctor, pharmacist, or health care provider.  2020 Elsevier/Gold Standard (2015-12-11 10:27:15)

## 2019-11-01 NOTE — Progress Notes (Signed)
Internal Medicine Clinic Attending  Case discussed with Dr. Chundi at the time of the visit.  We reviewed the resident's history and exam and pertinent patient test results.  I agree with the assessment, diagnosis, and plan of care documented in the resident's note. 

## 2019-11-01 NOTE — Progress Notes (Signed)
   CC: Elevated blood pressure   HPI:  [name] is a 53 y.o. with seizure disorder, varicose veins who presents for elevated blood pressure reading. Please see problem based charting for evaluation, assessment, and plan.  Past Medical History:  Diagnosis Date  . Seizures ([place]) 1987  . Varicose veins    Review of Systems:    Review of Systems  Constitutional: Negative for chills and fever.  Cardiovascular: Positive for palpitations. Negative for chest pain.  Gastrointestinal: Negative for abdominal pain, nausea and vomiting.   Physical Exam:  Vitals:   11/01/19 0936  BP: (!) 167/96  Pulse: 80  SpO2: 100%  Weight: 210 lb 11.2 oz (95.6 kg)  Height: 5' 7.5" (1.715 m)   Physical Exam  Constitutional: Appears well-developed and well-nourished. No distress.  HENT:  Head: Normocephalic and atraumatic.  Eyes: Conjunctivae are normal.  Cardiovascular: Normal rate, regular rhythm and normal heart sounds.  Respiratory: Effort normal and breath sounds normal. No respiratory distress. No wheezes.  GI: Soft. Bowel sounds are normal. No distension. There is no tenderness.  Musculoskeletal: No edema.  Neurological: Is alert.  Skin: Not diaphoretic. No erythema.  Psychiatric: Normal mood and affect. Behavior is normal. Judgment and thought content normal.   Assessment & Plan:   See Encounters Tab for problem based charting.  Patient discussed with Dr. Dareen Piano

## 2019-11-01 NOTE — Assessment & Plan Note (Signed)
The patient's blood pressure during this visit was 167/96 and repeat 156/97. The patient has had elevated bp at prior visits as well.   BP Readings from Last 3 Encounters:  11/01/19 (!) 156/97  10/17/19 (!) 159/94  09/18/19 (!) 142/98   Both maternal and paternal grandmother and father have hypertension.   She states that she has occasional palpitations. States that she was on a cardiac monitor for 1 week two years ago and did not find any abnormalites. Denies dizziness, chest pain, dyspnea.   Assessment and Plan The patient meets criteria for a diagnosis of hypertension due to elevated blood pressure separated in time. Will start patient on amlodipine 5mg  qd. Counseled on lifestyle changes. Performed an ekg which was normal sinus rhythm without st or t wave changes.   -needs bmp at next visit once she is able to speak with financial services

## 2019-11-29 ENCOUNTER — Other Ambulatory Visit: Payer: Self-pay

## 2019-11-29 ENCOUNTER — Ambulatory Visit (INDEPENDENT_AMBULATORY_CARE_PROVIDER_SITE_OTHER): Payer: Self-pay | Admitting: Internal Medicine

## 2019-11-29 ENCOUNTER — Encounter: Payer: Self-pay | Admitting: Internal Medicine

## 2019-11-29 VITALS — BP 152/97 | HR 85 | Temp 98.0°F | Ht 67.5 in | Wt 207.6 lb

## 2019-11-29 DIAGNOSIS — Z0000 Encounter for general adult medical examination without abnormal findings: Secondary | ICD-10-CM

## 2019-11-29 DIAGNOSIS — I10 Essential (primary) hypertension: Secondary | ICD-10-CM

## 2019-11-29 DIAGNOSIS — M62838 Other muscle spasm: Secondary | ICD-10-CM | POA: Insufficient documentation

## 2019-11-29 DIAGNOSIS — Z79899 Other long term (current) drug therapy: Secondary | ICD-10-CM

## 2019-11-29 DIAGNOSIS — M542 Cervicalgia: Secondary | ICD-10-CM

## 2019-11-29 MED ORDER — LISINOPRIL 5 MG PO TABS: 5.0000 mg | ORAL_TABLET | Freq: Every day | ORAL | 2 refills | Status: DC

## 2019-11-29 MED ORDER — AMLODIPINE BESYLATE 10 MG PO TABS: 10.0000 mg | ORAL_TABLET | Freq: Every day | ORAL | 2 refills | Status: DC

## 2019-11-29 MED ORDER — ATORVASTATIN CALCIUM 20 MG PO TABS: 20.0000 mg | ORAL_TABLET | Freq: Every day | ORAL | 11 refills | Status: DC

## 2019-11-29 MED ORDER — ASPIRIN EC 81 MG PO TBEC: 81.0000 mg | DELAYED_RELEASE_TABLET | Freq: Every day | ORAL | 3 refills | Status: DC

## 2019-11-29 MED FILL — ATORVASTATIN 20 MG TABLET: 20 | 30 days supply | Qty: 30 | Fill #0

## 2019-11-29 MED FILL — LISINOPRIL 5 MG TABS: 5 | 30 days supply | Qty: 30 | Fill #0

## 2019-11-29 MED FILL — AMLODIPINE BESYLATE 10 MG T: 10 | 30 days supply | Qty: 30 | Fill #0

## 2019-11-29 NOTE — Progress Notes (Signed)
   CC: blood pressure check  HPI:  [name] is a 54 y.o. with PMH as below.   Please see A&P for assessment of the patient's acute and chronic medical conditions.   Past Medical History:  Diagnosis Date  . Seizures ([place]) 1987  . Varicose veins    Review of Systems:   Review of Systems  Respiratory: Negative for shortness of breath and wheezing.   Cardiovascular: Negative for chest pain, leg swelling and PND.  Gastrointestinal: Negative for heartburn and nausea.  Genitourinary: Negative for frequency and urgency.  Musculoskeletal: Positive for neck pain. Negative for back pain, falls, joint pain and myalgias.  Neurological: Negative for dizziness, tingling, focal weakness and headaches.   Physical Exam:  Constitution: NAD, appears stated age, pleasant affect HENT: negative spurlings, no TTP, full active and passive range of motion without pain Cardio: RRR, no m/r/g, no LE edema  Abdominal: NTTP, soft, non-distended MSK: moving all extremities Neuro: normal affect, a&ox3 Skin: c/d/i    Vitals:   11/29/19 1521 11/29/19 1527 11/29/19 1551  BP: (!) 172/89 (!) 151/96 (!) 152/97  Pulse: 88 86 85  Temp: 98 F (36.7 C)    TempSrc: Oral    SpO2: 100%    Weight: 207 lb 9.6 oz (94.2 kg)    Height: 5' 7.5" (1.715 m)       Assessment & Plan:   See Encounters Tab for problem based charting.  Patient discussed with Dr. Philipp Ovens

## 2019-11-29 NOTE — Assessment & Plan Note (Signed)
-   due for colonoscopy soon, within the next year. Last one was five years ago but she is unsure why she was due within five years. Will follow-up with Dr. Lyndel Safe and should be receiving a letter when she is due - hasn't had tetanus since 90s - will need one at follow-up or outpatient referral if more affordable - last pap was 1-2 years ago, Obgyn was at [facility]. Thinks she has another pap due in a year.

## 2019-11-29 NOTE — Assessment & Plan Note (Signed)
Left posterior neck pain that has occurred three times over the past three months. The pain lasts for a couple of seconds and shoots up the back of her neck. No radiation to shoulder or other areas. No headache. The pain happened when she turned her head sharply to the left to look over her shoulder. Likely muscle spasm. Physical exam without any findings. Discussed gentle stretching, avoid hyperrotation and to follow-up if symptoms continue or worsens.

## 2019-11-29 NOTE — Patient Instructions (Signed)
Thank you for allowing [place] to provide your care today. Today we discussed your high blood pressure   I have ordered the following labs for you:   Basic metabolic panel   I will call if any are abnormal.    Please follow-up in one month for blood pressure check.    Please call the internal medicine center clinic if you have any questions or concerns, we may be able to help and keep you from a long and expensive emergency room wait. Our clinic and after hours phone number is [phone], the best time to call is Monday through Friday 9 am to 4 pm but there is always someone available 24/7 if you have an emergency. If you need medication refills please notify your pharmacy one week in advance and they will send [place] a request.

## 2019-11-29 NOTE — Assessment & Plan Note (Signed)
BP Readings from Last 3 Encounters:  11/29/19 (!) 152/97  11/01/19 (!) 156/97  10/17/19 (!) 159/94   Started on norvasc 5 mg qd one month ago. Blood pressure remains elevated, and she has been compliant with medications. ASCVD risk 12.5%.   - increase norvasc to 10 mg qd  - start lisinopril 5 mg qd  - BMP  - restart asa 81 mg and lipitor 20 mg

## 2019-11-30 LAB — BMP8+ANION GAP
Anion Gap: 12 mmol/L (ref 10.0–18.0)
BUN/Creatinine Ratio: 13 (ref 9–23)
BUN: 10 mg/dL (ref 6–24)
CO2: 25 mmol/L (ref 20–29)
Calcium: 9.3 mg/dL (ref 8.7–10.2)
Chloride: 104 mmol/L (ref 96–106)
Creatinine, Ser: 0.75 mg/dL (ref 0.57–1.00)
GFR calc Af Amer: 104 mL/min/{1.73_m2} (ref >59)
GFR calc non Af Amer: 90 mL/min/{1.73_m2} (ref >59)
Glucose: 81 mg/dL (ref 65–99)
Potassium: 3.9 mmol/L (ref 3.5–5.2)
Sodium: 141 mmol/L (ref 134–144)

## 2019-12-03 NOTE — Progress Notes (Signed)
Internal Medicine Clinic Attending  Case discussed with Dr. Seawell at the time of the visit.  We reviewed the resident's history and exam and pertinent patient test results.  I agree with the assessment, diagnosis, and plan of care documented in the resident's note.    

## 2019-12-03 NOTE — Addendum Note (Signed)
Addended by: Jodean Lima on: 12/03/2019 12:28 PM   Modules accepted: Level of Service

## 2019-12-24 ENCOUNTER — Ambulatory Visit: Payer: PRIVATE HEALTH INSURANCE

## 2019-12-27 MED FILL — AMLODIPINE BESYLATE 10 MG T: 10 | 30 days supply | Qty: 30 | Fill #1

## 2019-12-27 MED FILL — ATORVASTATIN 20 MG TABLET: 20 | 30 days supply | Qty: 30 | Fill #1

## 2019-12-27 MED FILL — LISINOPRIL 5 MG TABS: 5 | 30 days supply | Qty: 30 | Fill #1

## 2020-01-01 ENCOUNTER — Encounter: Payer: PRIVATE HEALTH INSURANCE | Admitting: Internal Medicine

## 2020-01-03 ENCOUNTER — Telehealth (HOSPITAL_COMMUNITY): Payer: Self-pay | Admitting: *Deleted

## 2020-01-03 NOTE — Telephone Encounter (Signed)
Patient called and left voicemail. Called patient back and she stated she is due for a Pap smear. Per previous [facility] not 09/18/2019 patients last Pap smear was in September 2019 and patient has no history of an abnormal Pap smear. Let patient know that based on when her last Pap smear was completed that her next Pap smear will be due in September 2022 and that [facility] will cover Pap smears every three years unless has a history of an abnormal Pap smear. Patient stated her doctor's office contacted her and told her she is due for a Pap smear. Told patient that if she can get her last Pap smear result faxed to [place] that if she is due that we can schedule either in [facility] or at one of our free Pap smear screenings. Patient given fax number. Patient stated she will have Pap smear faxed and will call to schedule if due. Patient verbalized understanding.

## 2020-02-04 MED FILL — LISINOPRIL 5 MG TABS: 5 | 30 days supply | Qty: 30 | Fill #2

## 2020-02-04 MED FILL — AMLODIPINE BESYLATE 10 MG T: 10 | 30 days supply | Qty: 30 | Fill #2

## 2020-02-04 MED FILL — ATORVASTATIN 20 MG TABLET: 20 | 30 days supply | Qty: 30 | Fill #2

## 2020-03-17 ENCOUNTER — Other Ambulatory Visit: Payer: Self-pay | Admitting: Internal Medicine

## 2020-03-17 DIAGNOSIS — I10 Essential (primary) hypertension: Secondary | ICD-10-CM

## 2020-03-17 MED FILL — ATORVASTATIN 20 MG TABLET: 20 | 30 days supply | Qty: 30 | Fill #3

## 2020-03-21 MED FILL — AMLODIPINE BESYLATE 10 MG T: 10 | 30 days supply | Qty: 30 | Fill #0

## 2020-03-21 MED FILL — LISINOPRIL 5 MG TABS: 5 | 30 days supply | Qty: 30 | Fill #0

## 2020-04-22 MED FILL — ATORVASTATIN 20 MG TABLET: 20 | 30 days supply | Qty: 30 | Fill #4

## 2020-04-22 MED FILL — LISINOPRIL 5 MG TABS: 5 | 30 days supply | Qty: 30 | Fill #1

## 2020-04-22 MED FILL — AMLODIPINE BESYLATE 10 MG T: 10 | 30 days supply | Qty: 30 | Fill #1

## 2020-05-28 MED FILL — ATORVASTATIN 20 MG TABLET: 20 | 30 days supply | Qty: 30 | Fill #5

## 2020-05-28 MED FILL — LISINOPRIL 5 MG TABS: 5 | 30 days supply | Qty: 30 | Fill #2

## 2020-05-28 MED FILL — AMLODIPINE BESYLATE 10 MG T: 10 | 30 days supply | Qty: 30 | Fill #2

## 2020-07-16 ENCOUNTER — Telehealth: Payer: Self-pay

## 2020-07-16 NOTE — Telephone Encounter (Signed)
Left message for patient about completing HC 3 for [facility]. Left name and number for patient to call back. 

## 2020-07-23 ENCOUNTER — Other Ambulatory Visit: Payer: Self-pay | Admitting: Internal Medicine

## 2020-07-23 DIAGNOSIS — I10 Essential (primary) hypertension: Secondary | ICD-10-CM

## 2020-07-23 MED FILL — LISINOPRIL 5 MG TABS: 5 | 90 days supply | Qty: 90 | Fill #0

## 2020-07-23 MED FILL — ATORVASTATIN 20 MG TABLET: 20 | 30 days supply | Qty: 30 | Fill #6

## 2020-07-23 MED FILL — AMLODIPINE BESYLATE 10 MG T: 10 | 90 days supply | Qty: 90 | Fill #0

## 2020-07-30 ENCOUNTER — Encounter: Payer: PRIVATE HEALTH INSURANCE | Admitting: Internal Medicine

## 2020-07-31 ENCOUNTER — Ambulatory Visit (INDEPENDENT_AMBULATORY_CARE_PROVIDER_SITE_OTHER): Payer: 59 | Admitting: Student

## 2020-07-31 ENCOUNTER — Other Ambulatory Visit: Payer: Self-pay

## 2020-07-31 ENCOUNTER — Encounter: Payer: Self-pay | Admitting: Student

## 2020-07-31 VITALS — BP 146/87 | HR 84 | Temp 98.1°F | Ht 67.5 in | Wt 199.5 lb

## 2020-07-31 DIAGNOSIS — R319 Hematuria, unspecified: Secondary | ICD-10-CM

## 2020-07-31 DIAGNOSIS — N390 Urinary tract infection, site not specified: Secondary | ICD-10-CM | POA: Diagnosis not present

## 2020-07-31 DIAGNOSIS — Z0001 Encounter for general adult medical examination with abnormal findings: Secondary | ICD-10-CM | POA: Diagnosis not present

## 2020-07-31 DIAGNOSIS — Z23 Encounter for immunization: Secondary | ICD-10-CM

## 2020-07-31 DIAGNOSIS — Z0000 Encounter for general adult medical examination without abnormal findings: Secondary | ICD-10-CM

## 2020-07-31 DIAGNOSIS — R350 Frequency of micturition: Secondary | ICD-10-CM | POA: Diagnosis not present

## 2020-07-31 DIAGNOSIS — N3001 Acute cystitis with hematuria: Secondary | ICD-10-CM

## 2020-07-31 LAB — POCT URINALYSIS DIPSTICK
Bilirubin, UA: NEGATIVE
Glucose, UA: NEGATIVE
Nitrite, UA: NEGATIVE
Protein, UA: NEGATIVE
Spec Grav, UA: 1.02 (ref 1.010–1.025)
Urobilinogen, UA: 1 E.U./dL
pH, UA: 7 (ref 5.0–8.0)

## 2020-07-31 MED ORDER — NITROFURANTOIN MONOHYD MACRO 100 MG PO CAPS: 100.0000 mg | ORAL_CAPSULE | Freq: Two times a day (BID) | ORAL | 0 refills | Status: AC

## 2020-07-31 MED FILL — NITROFURANTOIN MONO-MCR 100: 100 | 3 days supply | Qty: 6 | Fill #0

## 2020-07-31 NOTE — Progress Notes (Signed)
° °  CC: UTI  HPI:  Lindsey Burnett is a 54 y.o. with past medical history of hypertension who is here for a urinary tract infection.  Please see problem based charting for further details  Past Medical History:  Diagnosis Date   Seizures ([place]) 1987   Varicose veins    Review of Systems: As per HPI  Physical Exam:  Vitals:   07/31/20 1127  BP: (!) 146/87  Pulse: 84  Temp: 98.1 F (36.7 C)  TempSrc: Oral  SpO2: 100%  Weight: 199 lb 8 oz (90.5 kg)  Height: 5' 7.5" (1.715 m)   Physical Exam Constitutional:      General: She is not in acute distress. HENT:     Head: Normocephalic.  Eyes:     General: No scleral icterus.       Right eye: No discharge.        Left eye: No discharge.  Pulmonary:     Effort: No respiratory distress.     Breath sounds: Normal breath sounds.  Abdominal:     Tenderness: There is no right CVA tenderness or left CVA tenderness.  Musculoskeletal:     Cervical back: Normal range of motion.     Right lower leg: No edema.     Left lower leg: No edema.  Skin:    General: Skin is warm.     Coloration: Skin is not jaundiced.  Neurological:     Mental Status: She is alert.  Psychiatric:        Mood and Affect: Mood normal.     Assessment & Plan:   See Encounters Tab for problem based charting.  Patient seen with Dr. Dareen Piano

## 2020-07-31 NOTE — Assessment & Plan Note (Signed)
UTD on Covid vaccine Flushot today

## 2020-07-31 NOTE — Patient Instructions (Addendum)
Ms. Haslip,  It was a pleasure taking care of you today.  You have a urinary tract infection.  I will send in [place] to the [facility] outpatient pharmacy.  Please take 1 pill 2 times a day for 3 days.  Please drink plenty of fluid.  Please contact us if symptoms are not getting better or you start developing fever, chills, flank pain.  Take care, Dr. Alfonse Spruce

## 2020-07-31 NOTE — Assessment & Plan Note (Signed)
Patient states that symptom started 5 days ago.  She complains of dysuria, bad odor, and hematuria.  She denies fever, chills, flank pain, suprapubic tenderness.  UA shows trace blood, negative nitrite and trace leukocyte esterase.    She had UTI before and states that this feels like a UTI.  She does not remember what antibiotics were prescribed in the past.  Plan: -Nitrofurantoin 100 mg twice daily for 3 days -Advised patient to return if developing fever, chills, flank pain or worsening in symptoms

## 2020-08-04 NOTE — Progress Notes (Signed)
Internal Medicine Clinic Attending  I saw and evaluated the patient.  I personally confirmed the key portions of the history and exam documented by Dr. Nguyen and I reviewed pertinent patient test results.  The assessment, diagnosis, and plan were formulated together and I agree with the documentation in the resident's note.\  

## 2020-08-22 ENCOUNTER — Encounter: Payer: Self-pay | Admitting: Internal Medicine

## 2020-08-22 ENCOUNTER — Ambulatory Visit (INDEPENDENT_AMBULATORY_CARE_PROVIDER_SITE_OTHER): Payer: 59 | Admitting: Internal Medicine

## 2020-08-22 ENCOUNTER — Other Ambulatory Visit: Payer: Self-pay | Admitting: Internal Medicine

## 2020-08-22 VITALS — BP 149/82 | HR 72 | Temp 98.1°F | Ht 67.0 in | Wt 200.2 lb

## 2020-08-22 DIAGNOSIS — Z0000 Encounter for general adult medical examination without abnormal findings: Secondary | ICD-10-CM

## 2020-08-22 DIAGNOSIS — Z1239 Encounter for other screening for malignant neoplasm of breast: Secondary | ICD-10-CM | POA: Diagnosis not present

## 2020-08-22 DIAGNOSIS — Z0001 Encounter for general adult medical examination with abnormal findings: Secondary | ICD-10-CM | POA: Diagnosis not present

## 2020-08-22 DIAGNOSIS — I10 Essential (primary) hypertension: Secondary | ICD-10-CM | POA: Diagnosis not present

## 2020-08-22 DIAGNOSIS — Z23 Encounter for immunization: Secondary | ICD-10-CM | POA: Diagnosis not present

## 2020-08-22 MED ORDER — LISINOPRIL 10 MG PO TABS: 10.0000 mg | ORAL_TABLET | Freq: Every day | ORAL | 3 refills | Status: DC

## 2020-08-22 MED FILL — LISINOPRIL 10 MG TABS: 10 | 30 days supply | Qty: 30 | Fill #0

## 2020-08-22 NOTE — Patient Instructions (Signed)
Ms. Lindsey Burnett,  It was a pleasure seeing you in clinic. Today we discussed:   Hypertension: I would recommend low sodium diet with exercise at least 30 minutes per day 3x per week (daily if possible). I have increased your lisinopril to 10mg  daily.  Please follow up in 4 weeks for a BP check.   Healthcare maintenance: You received your Tdap shot today.  For your colonoscopy, please follow up with the GI doctor to schedule an appointment. For your mammogram, I will place an order for this and you may schedule this at your convenience. You are eligible to get your COVID Booster vaccine at this time. I would recommend doing so at your earliest convenience.  I am getting some routine labs today. I will call you with any abnormalities.  If you have any questions or concerns, please call our clinic at [phone] between 9am-5pm and after hours call [phone] and ask for the internal medicine resident on call. If you feel you are having a medical emergency please call 911.   Thank you, we look forward to helping you remain healthy!

## 2020-08-22 NOTE — Assessment & Plan Note (Addendum)
Patient with history of hypertension on amlodipine 10mg  daily and lisinopril 5 mg daily.  Blood pressure slightly improved since initiation of lisinopril; however, remains elevated at 149/82.  BP Readings from Last 3 Encounters:  08/22/20 (!) 149/82  07/31/20 (!) 146/87  11/29/19 (!) 152/97   Patient endorses medication compliance.  However, does endorse consumption of processed foods and sodas.  Discussed recommendation for low-sodium diet and increasing exercise.  Patient expresses understanding and is willing to attempt lifestyle modifications.  ASCVD risk based on prior labs 12.5%.  Plan: Continue amlodipine 10 mg daily Increase lisinopril to 10 mg daily BMP and lipid panel today Continue aspirin 81 mg and Lipitor 20 mg daily BP check in 4 weeks

## 2020-08-22 NOTE — Assessment & Plan Note (Signed)
Patient is up-to-date on Covid and flu shot vaccine. She received Tdap vaccine today  Patient is due for mammogram.  Order placed Patient is due for colonoscopy.  Patient advised to follow-up with GI for scheduling.  She expressed understanding HIV and HCV routine screening at this visit.

## 2020-08-24 LAB — LIPID PANEL
Chol/HDL Ratio: 2.9 ratio (ref 0.0–4.4)
Cholesterol, Total: 145 mg/dL (ref 100–199)
HDL: 50 mg/dL (ref >39)
LDL Chol Calc (NIH): 82 mg/dL (ref 0–99)
Triglycerides: 65 mg/dL (ref 0–149)
VLDL Cholesterol Cal: 13 mg/dL (ref 5–40)

## 2020-08-24 LAB — BMP8+ANION GAP
Anion Gap: 14 mmol/L (ref 10.0–18.0)
BUN/Creatinine Ratio: 12 (ref 9–23)
BUN: 9 mg/dL (ref 6–24)
CO2: 23 mmol/L (ref 20–29)
Calcium: 9.5 mg/dL (ref 8.7–10.2)
Chloride: 106 mmol/L (ref 96–106)
Creatinine, Ser: 0.74 mg/dL (ref 0.57–1.00)
GFR calc Af Amer: 105 mL/min/{1.73_m2} (ref >59)
GFR calc non Af Amer: 91 mL/min/{1.73_m2} (ref >59)
Glucose: 84 mg/dL (ref 65–99)
Potassium: 3.9 mmol/L (ref 3.5–5.2)
Sodium: 143 mmol/L (ref 134–144)

## 2020-08-24 LAB — CBC
Hematocrit: 38.8 % (ref 34.0–46.6)
Hemoglobin: 12.4 g/dL (ref 11.1–15.9)
MCH: 27.7 pg (ref 26.6–33.0)
MCHC: 32 g/dL (ref 31.5–35.7)
MCV: 87 fL (ref 79–97)
Platelets: 281 10*3/uL (ref 150–450)
RBC: 4.47 x10E6/uL (ref 3.77–5.28)
RDW: 12.9 % (ref 11.7–15.4)
WBC: 4.6 10*3/uL (ref 3.4–10.8)

## 2020-08-24 LAB — HEPATITIS C ANTIBODY: Hep C Virus Ab: <0.1 s/co ratio (ref 0.0–0.9)

## 2020-08-24 LAB — HIV ANTIBODY (ROUTINE TESTING W REFLEX): HIV Screen 4th Generation wRfx: NONREACTIVE

## 2020-08-26 MED FILL — ATORVASTATIN 20 MG TABLET: 20 | 30 days supply | Qty: 30 | Fill #7

## 2020-08-28 NOTE — Progress Notes (Addendum)
   CC: hypertension f/u  HPI:  [name] is a 54 y.o. female with PMHx as listed below presenting for hypertension follow up. No acute concerns at this time. Please see problem based charting for complete assessment and plan.  Past Medical History:  Diagnosis Date  . Seizures ([place]) 1987  . Varicose veins    Review of Systems:  Negative except as stated in HPI.  Physical Exam:  Vitals:   08/22/20 1316  BP: (!) 149/82  Pulse: 72  Temp: 98.1 F (36.7 C)  TempSrc: Oral  SpO2: 100%  Weight: 200 lb 3.2 oz (90.8 kg)  Height: 5\' 7"  (1.702 m)   Physical Exam  Constitutional: Appears well-developed and well-nourished. No distress.  HENT: Normocephalic and atraumatic, EOMI, conjunctiva normal, moist mucous membranes Cardiovascular: Normal rate, regular rhythm, S1 and S2 present, no murmurs, rubs, gallops.  Distal pulses intact Respiratory: No respiratory distress, no accessory muscle use.  Effort is normal.  Lungs are clear to auscultation bilaterally. Musculoskeletal: Normal bulk and tone.  No peripheral edema noted. Neurological: Is alert and oriented x4, no apparent focal deficits noted. Skin: Warm and dry.  No rash, erythema, lesions noted.  Assessment & Plan:   See Encounters Tab for problem based charting.  Patient discussed with Dr. Jimmye Norman   Internal Medicine Clinic Attending  Case discussed with Dr. Marva Panda  At the time of the visit.  We reviewed the resident's history and exam and pertinent patient test results.  I agree with the assessment, diagnosis, and plan of care documented in the resident's note. Dr. Dorian Pod

## 2020-08-29 NOTE — Progress Notes (Signed)
DOS 08/22/20:  Internal Medicine Clinic Attending  Case discussed with Dr. Marva Panda  At the time of the visit.  We reviewed the resident's history and exam and pertinent patient test results.  I agree with the assessment, diagnosis, and plan of care documented in the resident's note.

## 2020-09-04 ENCOUNTER — Encounter: Payer: Self-pay | Admitting: Gastroenterology

## 2020-09-18 ENCOUNTER — Ambulatory Visit
Admission: RE | Admit: 2020-09-18 | Discharge: 2020-09-18 | Disposition: A | Discharge status: Home / Self Care | Payer: 59 | Source: Ambulatory Visit | Attending: Internal Medicine | Admitting: Internal Medicine

## 2020-09-18 ENCOUNTER — Other Ambulatory Visit: Payer: Self-pay

## 2020-09-19 ENCOUNTER — Encounter: Payer: Self-pay | Admitting: Internal Medicine

## 2020-09-19 ENCOUNTER — Ambulatory Visit: Payer: 59 | Admitting: Internal Medicine

## 2020-09-19 ENCOUNTER — Other Ambulatory Visit: Payer: Self-pay

## 2020-09-19 ENCOUNTER — Other Ambulatory Visit (HOSPITAL_COMMUNITY)
Admission: RE | Admit: 2020-09-19 | Discharge: 2020-09-19 | Disposition: A | Discharge status: Home / Self Care | Payer: 59 | Source: Ambulatory Visit | Attending: Internal Medicine | Admitting: Internal Medicine

## 2020-09-19 VITALS — BP 132/71 | HR 72 | Temp 98.1°F | Ht 66.0 in | Wt 199.1 lb

## 2020-09-19 DIAGNOSIS — Z124 Encounter for screening for malignant neoplasm of cervix: Secondary | ICD-10-CM

## 2020-09-19 DIAGNOSIS — Z0000 Encounter for general adult medical examination without abnormal findings: Secondary | ICD-10-CM

## 2020-09-19 DIAGNOSIS — I10 Essential (primary) hypertension: Secondary | ICD-10-CM

## 2020-09-19 NOTE — Patient Instructions (Addendum)
Ms Logyn Dedominicis,  It was a pleasure seeing you in clinic. Today we discussed:   Hypertension: Continue taking your amlodipine, lisinopril and atorvastatin daily.   You had a pap smear and STI screening at this visit. I will call you with any abnormal results.   If you have any questions or concerns, please call our clinic at [phone] between 9am-5pm and after hours call [phone] and ask for the internal medicine resident on call. If you feel you are having a medical emergency please call 911.   Thank you, we look forward to helping you remain healthy!

## 2020-09-19 NOTE — Assessment & Plan Note (Signed)
BP Readings from Last 3 Encounters:  09/19/20 132/71  08/22/20 (!) 149/82  07/31/20 (!) 146/87   Patient is following up for BP check. At her last visit, patient's lisinopril increased to 10mg  daily and amlodipine 10mg  daily was continued. BP at this visit is improved to 132/71. She endorses medication compliance and increased exercise. We discussed recommendation for lifestyle modifications including low sodium diet and decreasing soda intake.  Current ASCVD risk with optimization of HTN is 4.7%.   Plan:  Continue amlodipine 10mg  daily and lisinopril 10mg  daily Continue atorvastatin 20mg  daily  Can discontinue aspirin at this time due to low ASCVD risk of fatal MI/stroke F/u in 3 months

## 2020-09-19 NOTE — Progress Notes (Signed)
   CC: hypertension follow up  HPI:  [name] is a 54 y.o. female with PMHx as listed below presenting for hypertension follow up. She does not have any acute concerns at this time. Please see problem based charting for complete assessment and plan.  Past Medical History:  Diagnosis Date  . Seizures ([place]) 1987  . Varicose veins    Review of Systems:  Negative except as stated in HPI.  Physical Exam:  Vitals:   09/19/20 1343  BP: 132/71  Pulse: 72  Temp: 98.1 F (36.7 C)  TempSrc: Oral  SpO2: 100%  Weight: 199 lb 1.6 oz (90.3 kg)  Height: 5\' 6"  (1.676 m)   Physical Exam  Constitutional: Appears well-developed and well-nourished. No distress.  Cardiovascular: Normal rate, regular rhythm, S1 and S2 present, no murmurs, rubs, gallops.  Distal pulses intact Respiratory: No respiratory distress, no accessory muscle use.  Effort is normal.  Lungs are clear to auscultation bilaterally. GI: Nondistended, soft, nontender to palpation, normal active bowel sounds GU: vulva nl; uterus prolapsed; vaginal walls well rugated; cervix without any lesions; no abnormal vaginal discharge noted  Musculoskeletal: Normal bulk and tone.  No peripheral edema noted. Neurological: Is alert and oriented x4, no apparent focal deficits noted. Skin: Warm and dry.  No rash, erythema, lesions noted.   Assessment & Plan:   See Encounters Tab for problem based charting.  Patient discussed with Dr. Evette Doffing

## 2020-09-19 NOTE — Assessment & Plan Note (Addendum)
Pap smear at this visit.  Mammogram scheduled for November. She has an appointment to set up colonoscopy in December.  DXA scan for osteoporosis at age 54.

## 2020-09-20 LAB — HIV ANTIBODY (ROUTINE TESTING W REFLEX): HIV Screen 4th Generation wRfx: NONREACTIVE

## 2020-09-20 LAB — RPR: RPR Ser Ql: NONREACTIVE

## 2020-09-22 LAB — CERVICOVAGINAL ANCILLARY ONLY
Bacterial Vaginitis (gardnerella): NEGATIVE
Candida Glabrata: NEGATIVE
Candida Vaginitis: NEGATIVE
Chlamydia: NEGATIVE
Comment: NEGATIVE
Comment: NEGATIVE
Comment: NEGATIVE
Comment: NEGATIVE
Comment: NEGATIVE
Comment: NORMAL
Neisseria Gonorrhea: NEGATIVE
Trichomonas: NEGATIVE

## 2020-09-22 NOTE — Addendum Note (Signed)
Addended by: Lalla Brothers T on: 09/22/2020 03:21 PM   Modules accepted: Level of Service

## 2020-09-22 NOTE — Progress Notes (Signed)
Internal Medicine Clinic Attending ° °Case discussed with Dr. Aslam  At the time of the visit.  We reviewed the resident’s history and exam and pertinent patient test results.  I agree with the assessment, diagnosis, and plan of care documented in the resident’s note.  °

## 2020-09-23 LAB — CYTOLOGY - PAP
Comment: NEGATIVE
Diagnosis: NEGATIVE
High risk HPV: NEGATIVE

## 2020-09-30 MED FILL — ATORVASTATIN 20 MG TABLET: 20 | 30 days supply | Qty: 30 | Fill #8

## 2020-10-08 ENCOUNTER — Encounter: Payer: Self-pay | Admitting: *Deleted

## 2020-10-08 DIAGNOSIS — R002 Palpitations: Secondary | ICD-10-CM | POA: Insufficient documentation

## 2020-10-08 HISTORY — PX: MOHS SURGERY: SUR867

## 2020-10-23 ENCOUNTER — Other Ambulatory Visit: Payer: Self-pay

## 2020-10-23 ENCOUNTER — Ambulatory Visit (AMBULATORY_SURGERY_CENTER): Payer: Self-pay | Admitting: *Deleted

## 2020-10-23 VITALS — Ht 66.0 in | Wt 200.0 lb

## 2020-10-23 DIAGNOSIS — Z800 Family history of malignant neoplasm of digestive organs: Secondary | ICD-10-CM

## 2020-10-23 MED ORDER — NA SULFATE-K SULFATE-MG SULF 17.5-3.13-1.6 GM/177ML PO SOLN: ORAL | 0 refills | Status: DC

## 2020-10-23 NOTE — Progress Notes (Signed)
Patient is here in-person for PV. Patient denies any allergies to eggs or soy. Patient denies any problems with anesthesia/sedation. Patient denies any oxygen use at home. Patient denies taking any diet/weight loss medications or blood thinners. Patient is not being treated for MRSA or C-diff. Patient is aware of our care-partner policy and [id number] safety protocol. EMMI education assigned to the patient for the procedure, sent to [place].   COVID-19 vaccines completed on 03/14/20, per patient.

## 2020-10-30 MED FILL — LISINOPRIL 10 MG TABS: 10 | 30 days supply | Qty: 30 | Fill #1

## 2020-10-30 MED FILL — AMLODIPINE BESYLATE 10 MG T: 10 | 90 days supply | Qty: 90 | Fill #1

## 2020-10-30 MED FILL — ATORVASTATIN CALCIUM 20 MG: 20 | 30 days supply | Qty: 30 | Fill #9

## 2020-11-06 ENCOUNTER — Other Ambulatory Visit: Payer: Self-pay

## 2020-11-06 ENCOUNTER — Encounter: Payer: Self-pay | Admitting: Gastroenterology

## 2020-11-06 ENCOUNTER — Ambulatory Visit (AMBULATORY_SURGERY_CENTER): Payer: 59 | Admitting: Gastroenterology

## 2020-11-06 VITALS — BP 109/66 | HR 67 | Temp 97.5°F | Resp 10 | Ht 66.0 in | Wt 200.0 lb

## 2020-11-06 DIAGNOSIS — Z800 Family history of malignant neoplasm of digestive organs: Secondary | ICD-10-CM | POA: Diagnosis present

## 2020-11-06 DIAGNOSIS — Z1211 Encounter for screening for malignant neoplasm of colon: Secondary | ICD-10-CM | POA: Diagnosis not present

## 2020-11-06 MED ORDER — SODIUM CHLORIDE 0.9 % IV SOLN: 500.0000 mL | Freq: Once | INTRAVENOUS | Status: DC

## 2020-11-06 NOTE — Progress Notes (Signed)
Pt's states no medical or surgical changes since previsit or office visit.   VS taken by CW 

## 2020-11-06 NOTE — Progress Notes (Signed)
PT taken to PACU. Monitors in place. VSS. Report given to RN. 

## 2020-11-06 NOTE — Patient Instructions (Signed)
YOU HAD AN ENDOSCOPIC PROCEDURE TODAY AT THE [facility]:   Refer to the procedure report that was given to you for any specific questions about what was found during the examination.  If the procedure report does not answer your questions, please call your gastroenterologist to clarify.  If you requested that your care partner not be given the details of your procedure findings, then the procedure report has been included in a sealed envelope for you to review at your convenience later.  YOU SHOULD EXPECT: Some feelings of bloating in the abdomen. Passage of more gas than usual.  Walking can help get rid of the air that was put into your GI tract during the procedure and reduce the bloating. If you had a lower endoscopy (such as a colonoscopy or flexible sigmoidoscopy) you may notice spotting of blood in your stool or on the toilet paper. If you underwent a bowel prep for your procedure, you may not have a normal bowel movement for a few days.  Please Note:  You might notice some irritation and congestion in your nose or some drainage.  This is from the oxygen used during your procedure.  There is no need for concern and it should clear up in a day or so.  SYMPTOMS TO REPORT IMMEDIATELY:   Following lower endoscopy (colonoscopy or flexible sigmoidoscopy):  Excessive amounts of blood in the stool  Significant tenderness or worsening of abdominal pains  Swelling of the abdomen that is new, acute  Fever of 100F or higher  For urgent or emergent issues, a gastroenterologist can be reached at any hour by calling [phone]. Do not use MyChart messaging for urgent concerns.    DIET:  We do recommend a small meal at first, but then you may proceed to your regular diet.  Drink plenty of fluids but you should avoid alcoholic beverages for 24 hours.  ACTIVITY:  You should plan to take it easy for the rest of today and you should NOT DRIVE or use heavy machinery until tomorrow (because  of the sedation medicines used during the test).    FOLLOW UP: Our staff will call the number listed on your records 48-72 hours following your procedure to check on you and address any questions or concerns that you may have regarding the information given to you following your procedure. If we do not reach you, we will leave a message.  We will attempt to reach you two times.  During this call, we will ask if you have developed any symptoms of COVID 19. If you develop any symptoms (ie: fever, flu-like symptoms, shortness of breath, cough etc.) before then, please call [phone].  If you test positive for Covid 19 in the 2 weeks post procedure, please call and report this information to us.    If any biopsies were taken you will be contacted by phone or by letter within the next 1-3 weeks.  Please call us at [phone] if you have not heard about the biopsies in 3 weeks.    SIGNATURES/CONFIDENTIALITY: You and/or your care partner have signed paperwork which will be entered into your electronic medical record.  These signatures attest to the fact that that the information above on your After Visit Summary has been reviewed and is understood.  Full responsibility of the confidentiality of this discharge information lies with you and/or your care-partner. 

## 2020-11-06 NOTE — Op Note (Signed)
[name]: Lindsey Burnett Procedure Date: 11/06/2020 2:13 PM MRN: [id number] Endoscopist: Jackquline Denmark , MD Age: 54 Referring MD:  Date of Birth: 1965/12/02 Gender: Female Account #: [id number] Procedure:                Colonoscopy Indications:              Colon cancer screening in patient at increased                            risk: Family history of colorectal cancer in 2nd                            degree relative. Medicines:                Monitored Anesthesia Care Procedure:                Pre-Anesthesia Assessment:                           - Prior to the procedure, a History and Physical                            was performed, and patient medications and                            allergies were reviewed. The patient's tolerance of                            previous anesthesia was also reviewed. The risks                            and benefits of the procedure and the sedation                            options and risks were discussed with the patient.                            All questions were answered, and informed consent                            was obtained. Prior Anticoagulants: The patient has                            taken no previous anticoagulant or antiplatelet                            agents. ASA Grade Assessment: II - A patient with                            mild systemic disease. After reviewing the risks                            and benefits, the patient was deemed in  satisfactory condition to undergo the procedure.                           After obtaining informed consent, the colonoscope                            was passed under direct vision. Throughout the                            procedure, the patient's blood pressure, pulse, and                            oxygen saturations were monitored continuously. The                            Olympus [id number]-[id number] (#[id number]) Colonoscope was                             introduced through the anus and advanced to the 2                            cm into the ileum. The colonoscopy was performed                            without difficulty. The patient tolerated the                            procedure well. The quality of the bowel                            preparation was good after suctioning and                            aspiration. The ileocecal valve, appendiceal                            orifice, and rectum were photographed. Scope In: 2:18:59 PM Scope Out: 2:37:23 PM Scope Withdrawal Time: 0 hours 11 minutes 51 seconds  Total Procedure Duration: 0 hours 18 minutes 24 seconds  Findings:                 A few small-mouthed diverticula were found in the                            sigmoid colon and ascending colon.                           Non-bleeding internal hemorrhoids were found during                            retroflexion. The hemorrhoids were small.                           The exam was otherwise without abnormality on  direct and retroflexion views. The colon was highly                            redundant. Complications:            No immediate complications. Estimated Blood Loss:     Estimated blood loss: none. Impression:               - Mild pancolonic diverticulosis.                           - Non-bleeding internal hemorrhoids.                           - Otherwise normal colonoscopy. The colon was                            highly redundant.                           - No specimens collected. Recommendation:           - Patient has a contact number available for                            emergencies. The signs and symptoms of potential                            delayed complications were discussed with the                            patient. Return to normal activities tomorrow.                            Written discharge instructions were provided to the                             patient.                           - Resume previous diet.                           - Continue present medications.                           - Repeat colonoscopy in 10 years for screening                            purposes. Earlier, if with any new problems or                            change in family history.                           - Return to GI clinic PRN.                           -  The findings and recommendations were discussed                            with the patient's husband Antoine Primas, MD 11/06/2020 2:43:26 PM This report has been signed electronically.

## 2020-11-10 ENCOUNTER — Telehealth: Payer: Self-pay | Admitting: *Deleted

## 2020-11-10 NOTE — Telephone Encounter (Signed)
  Follow up Call-  Call back number 11/06/2020  Post procedure Call Back phone  # [phone]  Permission to leave phone message Yes  Some recent data might be hidden     Patient questions:  Do you have a fever, pain , or abdominal swelling? No. Pain Score  0 *  Have you tolerated food without any problems? Yes.    Have you been able to return to your normal activities? Yes.    Do you have any questions about your discharge instructions: Diet   No. Medications  No. Follow up visit  No.  Do you have questions or concerns about your Care? No.  Actions: * If pain score is 4 or above: No action needed, pain <4.  1. Have you developed a fever since your procedure? no  2.   Have you had an respiratory symptoms (SOB or cough) since your procedure? no  3.   Have you tested positive for COVID 19 since your procedure no  4.   Have you had any family members/close contacts diagnosed with the COVID 19 since your procedure?  no   If yes to any of these questions please route to Joylene John, RN and Joella Prince, RN

## 2020-12-04 ENCOUNTER — Other Ambulatory Visit: Payer: Self-pay | Admitting: Internal Medicine

## 2020-12-04 DIAGNOSIS — I10 Essential (primary) hypertension: Secondary | ICD-10-CM

## 2020-12-04 MED FILL — ATORVASTATIN CALCIUM 20 MG: 20 | 30 days supply | Qty: 30 | Fill #0

## 2020-12-04 MED FILL — LISINOPRIL 10 MG TABS: 10 | 30 days supply | Qty: 30 | Fill #2

## 2021-01-02 MED FILL — ATORVASTATIN CALCIUM 20 MG: 20 | 30 days supply | Qty: 30 | Fill #1

## 2021-01-02 MED FILL — LISINOPRIL 10 MG TABS: 10 | 30 days supply | Qty: 30 | Fill #3

## 2021-02-04 ENCOUNTER — Other Ambulatory Visit: Payer: Self-pay | Admitting: Student

## 2021-02-04 ENCOUNTER — Other Ambulatory Visit: Payer: Self-pay | Admitting: Internal Medicine

## 2021-02-04 DIAGNOSIS — I10 Essential (primary) hypertension: Secondary | ICD-10-CM

## 2021-02-23 ENCOUNTER — Ambulatory Visit (INDEPENDENT_AMBULATORY_CARE_PROVIDER_SITE_OTHER): Payer: 59 | Admitting: Internal Medicine

## 2021-02-23 ENCOUNTER — Encounter: Payer: Self-pay | Admitting: Internal Medicine

## 2021-02-23 ENCOUNTER — Other Ambulatory Visit: Payer: Self-pay

## 2021-02-23 DIAGNOSIS — I10 Essential (primary) hypertension: Secondary | ICD-10-CM

## 2021-02-23 DIAGNOSIS — Z0000 Encounter for general adult medical examination without abnormal findings: Secondary | ICD-10-CM

## 2021-02-23 NOTE — Assessment & Plan Note (Addendum)
Patient had pap smear in 08/2020 - normal  Mammogram in 09/2020 - normal  Colonoscopy in 10/2020 - diverticulosis, nonhemorrhagic internal hemorrhoids, recommended for repeat in 10 years  Up-to-date on all age appropriate vaccines   DEXA scan due at age 55.

## 2021-02-23 NOTE — Progress Notes (Signed)
   CC: hypertension f/u   HPI:  [name] is a 55 y.o. female with PMHx as stated below presenting for follow up of her hypertension. She does not have any acute concerns at this time. Please see problem based charting for complete assessment and plan.  Past Medical History:  Diagnosis Date  . Cancer ([place])    skin cancer-basil cell face removed   . Hyperlipidemia   . Hypertension   . Seizures ([place]) 1987-last time   none currently per pt  . Varicose veins    Review of Systems:  Negative except as stated in HPI.  Physical Exam:  Vitals:   02/23/21 1436  BP: 128/89  Pulse: 78  Temp: 98.5 F (36.9 C)  TempSrc: Oral  SpO2: 100%  Weight: 197 lb (89.4 kg)  Height: 5\' 6"  (1.676 m)   Physical Exam  Constitutional: Appears well-developed and well-nourished. No distress.  HENT: Normocephalic and atraumatic, EOMI, moist mucous membranes Cardiovascular: Normal rate, regular rhythm, S1 and S2 present, no murmurs, rubs, gallops.  Distal pulses intact Respiratory: No respiratory distress, no accessory muscle use.  Effort is normal.  Lungs are clear to auscultation bilaterally. GI: Nondistended, soft, nontender to palpation, active bowel sounds Musculoskeletal: Normal bulk and tone.  No peripheral edema noted. Neurological: Is alert and oriented x4, no apparent focal deficits noted. Skin: Warm and dry.  No rash, erythema, lesions noted.   Assessment & Plan:   See Encounters Tab for problem based charting.  Patient discussed with Dr. Dareen Piano

## 2021-02-23 NOTE — Patient Instructions (Signed)
Lindsey Burnett,  It was a pleasure seeing you in clinic. Today we discussed:   Blood pressure: Your blood pressure is improved today at 128/89. Please continue to take your medications as prescribed. As we discussed, I anticipate your blood pressure improving further with weight loss.   If you have any questions or concerns, please call our clinic at [phone] between 9am-5pm and after hours call [phone] and ask for the internal medicine resident on call. If you feel you are having a medical emergency please call 911.   Thank you, we look forward to helping you remain healthy!   DASH Eating Plan DASH stands for Dietary Approaches to Stop Hypertension. The DASH eating plan is a healthy eating plan that has been shown to:  Reduce high blood pressure (hypertension).  Reduce your risk for type 2 diabetes, heart disease, and stroke.  Help with weight loss. What are tips for following this plan? Reading food labels  Check food labels for the amount of salt (sodium) per serving. Choose foods with less than 5 percent of the Daily Value of sodium. Generally, foods with less than 300 milligrams (mg) of sodium per serving fit into this eating plan.  To find whole grains, look for the word "whole" as the first word in the ingredient list. Shopping  Buy products labeled as "low-sodium" or "no salt added."  Buy fresh foods. Avoid canned foods and pre-made or frozen meals. Cooking  Avoid adding salt when cooking. Use salt-free seasonings or herbs instead of table salt or sea salt. Check with your health care provider or pharmacist before using salt substitutes.  Do not fry foods. Cook foods using healthy methods such as baking, boiling, grilling, roasting, and broiling instead.  Cook with heart-healthy oils, such as olive, canola, avocado, soybean, or sunflower oil. Meal planning  Eat a balanced diet that includes: ? 4 or more servings of fruits and 4 or more servings of vegetables each  day. Try to fill one-half of your plate with fruits and vegetables. ? 6-8 servings of whole grains each day. ? Less than 6 oz (170 g) of lean meat, poultry, or fish each day. A 3-oz (85-g) serving of meat is about the same size as a deck of cards. One egg equals 1 oz (28 g). ? 2-3 servings of low-fat dairy each day. One serving is 1 cup (237 mL). ? 1 serving of nuts, seeds, or beans 5 times each week. ? 2-3 servings of heart-healthy fats. Healthy fats called omega-3 fatty acids are found in foods such as walnuts, flaxseeds, fortified milks, and eggs. These fats are also found in cold-water fish, such as sardines, salmon, and mackerel.  Limit how much you eat of: ? Canned or prepackaged foods. ? Food that is high in trans fat, such as some fried foods. ? Food that is high in saturated fat, such as fatty meat. ? Desserts and other sweets, sugary drinks, and other foods with added sugar. ? Full-fat dairy products.  Do not salt foods before eating.  Do not eat more than 4 egg yolks a week.  Try to eat at least 2 vegetarian meals a week.  Eat more home-cooked food and less restaurant, buffet, and fast food.   Lifestyle  When eating at a restaurant, ask that your food be prepared with less salt or no salt, if possible.  If you drink alcohol: ? Limit how much you use to:  0-1 drink a day for women who are not pregnant.  0-2  drinks a day for men. ? Be aware of how much alcohol is in your drink. In the [place] one drink equals one 12 oz bottle of beer (355 mL), one 5 oz glass of wine (148 mL), or one 1 oz glass of hard liquor (44 mL). General information  Avoid eating more than 2,300 mg of salt a day. If you have hypertension, you may need to reduce your sodium intake to 1,500 mg a day.  Work with your health care provider to maintain a healthy body weight or to lose weight. Ask what an ideal weight is for you.  Get at least 30 minutes of exercise that causes your heart to beat faster  (aerobic exercise) most days of the week. Activities may include walking, swimming, or biking.  Work with your health care provider or dietitian to adjust your eating plan to your individual calorie needs. What foods should I eat? Fruits All fresh, dried, or frozen fruit. Canned fruit in natural juice (without added sugar). Vegetables Fresh or frozen vegetables (raw, steamed, roasted, or grilled). Low-sodium or reduced-sodium tomato and vegetable juice. Low-sodium or reduced-sodium tomato sauce and tomato paste. Low-sodium or reduced-sodium canned vegetables. Grains Whole-grain or whole-wheat bread. Whole-grain or whole-wheat pasta. Brown rice. [place]. Bulgur. Whole-grain and low-sodium cereals. Pita bread. Low-fat, low-sodium crackers. Whole-wheat flour tortillas. Meats and other proteins Skinless chicken or [place]. Ground chicken or [place]. Pork with fat trimmed off. Fish and seafood. Egg whites. Dried beans, peas, or lentils. Unsalted nuts, nut butters, and seeds. Unsalted canned beans. Lean cuts of beef with fat trimmed off. Low-sodium, lean precooked or cured meat, such as sausages or meat loaves. Dairy Low-fat (1%) or fat-free (skim) milk. Reduced-fat, low-fat, or fat-free cheeses. Nonfat, low-sodium ricotta or cottage cheese. Low-fat or nonfat yogurt. Low-fat, low-sodium cheese. Fats and oils Soft margarine without trans fats. Vegetable oil. Reduced-fat, low-fat, or light mayonnaise and salad dressings (reduced-sodium). Canola, safflower, olive, avocado, soybean, and sunflower oils. Avocado. Seasonings and condiments Herbs. Spices. Seasoning mixes without salt. Other foods Unsalted popcorn and pretzels. Fat-free sweets. The items listed above may not be a complete list of foods and beverages you can eat. Contact a dietitian for more information. What foods should I avoid? Fruits Canned fruit in a light or heavy syrup. Fried fruit. Fruit in cream or butter  sauce. Vegetables Creamed or fried vegetables. Vegetables in a cheese sauce. Regular canned vegetables (not low-sodium or reduced-sodium). Regular canned tomato sauce and paste (not low-sodium or reduced-sodium). Regular tomato and vegetable juice (not low-sodium or reduced-sodium). Angie Fava. Olives. Grains Baked goods made with fat, such as croissants, muffins, or some breads. Dry pasta or rice meal packs. Meats and other proteins Fatty cuts of meat. Ribs. Fried meat. Berniece Salines. Bologna, salami, and other precooked or cured meats, such as sausages or meat loaves. Fat from the back of a pig (fatback). Bratwurst. Salted nuts and seeds. Canned beans with added salt. Canned or smoked fish. Whole eggs or egg yolks. Chicken or [place] with skin. Dairy Whole or 2% milk, cream, and half-and-half. Whole or full-fat cream cheese. Whole-fat or sweetened yogurt. Full-fat cheese. Nondairy creamers. Whipped toppings. Processed cheese and cheese spreads. Fats and oils Butter. Stick margarine. Lard. Shortening. Ghee. Bacon fat. Tropical oils, such as coconut, palm kernel, or palm oil. Seasonings and condiments Onion salt, garlic salt, seasoned salt, table salt, and sea salt. Worcestershire sauce. Tartar sauce. Barbecue sauce. Teriyaki sauce. Soy sauce, including reduced-sodium. Steak sauce. Canned and packaged gravies. Fish sauce. Oyster sauce.  Cocktail sauce. Store-bought horseradish. Ketchup. Mustard. Meat flavorings and tenderizers. Bouillon cubes. Hot sauces. Pre-made or packaged marinades. Pre-made or packaged taco seasonings. Relishes. Regular salad dressings. Other foods Salted popcorn and pretzels. The items listed above may not be a complete list of foods and beverages you should avoid. Contact a dietitian for more information. Where to find more information  National Heart, Lung, and Blood Institute: https://wilson-eaton.com/  American Heart Association: www.heart.org  Academy of Nutrition and Dietetics:  www.eatright.[place]: www.kidney.org Summary  The DASH eating plan is a healthy eating plan that has been shown to reduce high blood pressure (hypertension). It may also reduce your risk for type 2 diabetes, heart disease, and stroke.  When on the DASH eating plan, aim to eat more fresh fruits and vegetables, whole grains, lean proteins, low-fat dairy, and heart-healthy fats.  With the DASH eating plan, you should limit salt (sodium) intake to 2,300 mg a day. If you have hypertension, you may need to reduce your sodium intake to 1,500 mg a day.  Work with your health care provider or dietitian to adjust your eating plan to your individual calorie needs. This information is not intended to replace advice given to you by your health care provider. Make sure you discuss any questions you have with your health care provider. Document Revised: 10/12/2019 Document Reviewed: 10/12/2019 Elsevier Patient Education  2021 [employer].

## 2021-02-23 NOTE — Assessment & Plan Note (Signed)
BP Readings from Last 3 Encounters:  02/23/21 128/89  11/06/20 109/66  09/19/20 132/71   Ms Lindsey Burnett is following up for hypertension. She is on lisinopril 10mg  daily and amlodipine 10mg  daily. She endorses medication compliance and no adverse effects at this time. She notes attempt for weight loss with calorie restriction. Current ASCVD risk is 4.5%.  Goal BP <120/80  Plan: Continue with amlodipine 10mg  daily and lisinopril 10mg  daily Continue atorvastatin 20mg  daily Patient encouraged for lifestyle modification with DASH diet

## 2021-02-26 NOTE — Progress Notes (Signed)
Internal Medicine Clinic Attending ° °Case discussed with Dr. Aslam  At the time of the visit.  We reviewed the resident’s history and exam and pertinent patient test results.  I agree with the assessment, diagnosis, and plan of care documented in the resident’s note.  °

## 2021-03-18 ENCOUNTER — Other Ambulatory Visit (HOSPITAL_COMMUNITY): Payer: Self-pay

## 2021-03-18 MED FILL — Atorvastatin Calcium Tab 20 MG (Base Equivalent): ORAL | 30 days supply | Qty: 30 | Fill #0 | Status: AC

## 2021-03-18 MED FILL — Lisinopril Tab 10 MG: ORAL | 30 days supply | Qty: 30 | Fill #0 | Status: AC

## 2021-05-05 ENCOUNTER — Other Ambulatory Visit (HOSPITAL_COMMUNITY): Payer: Self-pay

## 2021-05-05 MED FILL — Lisinopril Tab 10 MG: ORAL | 30 days supply | Qty: 30 | Fill #1 | Status: AC

## 2021-05-05 MED FILL — Atorvastatin Calcium Tab 20 MG (Base Equivalent): ORAL | 30 days supply | Qty: 30 | Fill #1 | Status: AC

## 2021-05-26 ENCOUNTER — Encounter: Payer: Self-pay | Admitting: *Deleted

## 2021-06-02 MED FILL — Amlodipine Besylate Tab 10 MG (Base Equivalent): ORAL | 90 days supply | Qty: 90 | Fill #0 | Status: AC

## 2021-06-02 MED FILL — Lisinopril Tab 10 MG: ORAL | 30 days supply | Qty: 30 | Fill #2 | Status: AC

## 2021-06-02 MED FILL — Atorvastatin Calcium Tab 20 MG (Base Equivalent): ORAL | 30 days supply | Qty: 30 | Fill #2 | Status: AC

## 2021-06-03 ENCOUNTER — Other Ambulatory Visit (HOSPITAL_COMMUNITY): Payer: Self-pay

## 2021-07-13 ENCOUNTER — Other Ambulatory Visit: Payer: Self-pay | Admitting: Student

## 2021-07-13 ENCOUNTER — Other Ambulatory Visit (HOSPITAL_COMMUNITY): Payer: Self-pay

## 2021-07-13 DIAGNOSIS — I10 Essential (primary) hypertension: Secondary | ICD-10-CM

## 2021-07-13 MED FILL — Atorvastatin Calcium Tab 20 MG (Base Equivalent): ORAL | 30 days supply | Qty: 30 | Fill #3 | Status: AC

## 2021-07-14 ENCOUNTER — Other Ambulatory Visit (HOSPITAL_COMMUNITY): Payer: Self-pay

## 2021-07-14 MED ORDER — LISINOPRIL 10 MG PO TABS
10.0000 mg | ORAL_TABLET | Freq: Every day | ORAL | 3 refills | Status: DC
  Filled 2021-07-14: qty 30, 30d supply, fill #0
  Filled 2021-08-19: qty 30, 30d supply, fill #1

## 2021-08-13 ENCOUNTER — Other Ambulatory Visit: Payer: Self-pay | Admitting: Internal Medicine

## 2021-08-13 DIAGNOSIS — Z1231 Encounter for screening mammogram for malignant neoplasm of breast: Secondary | ICD-10-CM

## 2021-08-19 ENCOUNTER — Other Ambulatory Visit (HOSPITAL_COMMUNITY): Payer: Self-pay

## 2021-08-19 ENCOUNTER — Other Ambulatory Visit: Payer: Self-pay

## 2021-08-19 MED FILL — Atorvastatin Calcium Tab 20 MG (Base Equivalent): ORAL | 30 days supply | Qty: 30 | Fill #4 | Status: AC

## 2021-08-24 ENCOUNTER — Other Ambulatory Visit: Payer: Self-pay

## 2021-08-24 ENCOUNTER — Ambulatory Visit
Admission: RE | Admit: 2021-08-24 | Discharge: 2021-08-24 | Disposition: A | Discharge status: Home / Self Care | Payer: 59 | Source: Ambulatory Visit

## 2021-08-24 ENCOUNTER — Other Ambulatory Visit (HOSPITAL_COMMUNITY): Payer: Self-pay

## 2021-08-24 DIAGNOSIS — Z1231 Encounter for screening mammogram for malignant neoplasm of breast: Secondary | ICD-10-CM

## 2021-08-27 ENCOUNTER — Other Ambulatory Visit (HOSPITAL_COMMUNITY): Payer: Self-pay

## 2021-08-31 ENCOUNTER — Other Ambulatory Visit: Payer: Self-pay | Admitting: Internal Medicine

## 2021-08-31 ENCOUNTER — Other Ambulatory Visit (HOSPITAL_COMMUNITY): Payer: Self-pay

## 2021-08-31 DIAGNOSIS — I10 Essential (primary) hypertension: Secondary | ICD-10-CM

## 2021-09-01 ENCOUNTER — Other Ambulatory Visit (HOSPITAL_COMMUNITY): Payer: Self-pay

## 2021-09-01 MED ORDER — AMLODIPINE BESYLATE 10 MG PO TABS
10.0000 mg | ORAL_TABLET | Freq: Every day | ORAL | 3 refills | Status: DC
  Filled 2021-09-01: qty 90, 90d supply, fill #0

## 2021-09-01 NOTE — Telephone Encounter (Signed)
Last ov 02/23/21 With instructions to return in 3 mths Has upcoming appt 09/16/21 Will send refill request to pcp for review

## 2021-09-09 ENCOUNTER — Other Ambulatory Visit (HOSPITAL_COMMUNITY): Payer: Self-pay

## 2021-09-16 ENCOUNTER — Ambulatory Visit (INDEPENDENT_AMBULATORY_CARE_PROVIDER_SITE_OTHER): Payer: 59 | Admitting: Internal Medicine

## 2021-09-16 ENCOUNTER — Encounter: Payer: Self-pay | Admitting: Internal Medicine

## 2021-09-16 VITALS — BP 129/79 | HR 68 | Temp 98.4°F | Wt 193.4 lb

## 2021-09-16 DIAGNOSIS — Z0000 Encounter for general adult medical examination without abnormal findings: Secondary | ICD-10-CM | POA: Diagnosis not present

## 2021-09-16 DIAGNOSIS — I10 Essential (primary) hypertension: Secondary | ICD-10-CM | POA: Diagnosis not present

## 2021-09-16 DIAGNOSIS — E785 Hyperlipidemia, unspecified: Secondary | ICD-10-CM

## 2021-09-16 MED ORDER — AMLODIPINE BESY-BENAZEPRIL HCL 5-10 MG PO CAPS: 1.0000 | ORAL_CAPSULE | Freq: Every day | ORAL | 1 refills | Status: DC

## 2021-09-16 NOTE — Assessment & Plan Note (Signed)
Patient with history of hyperlipidemia with initial LDL 110 in 2020 for which she was started on lipitor. LDL on lipid panel last year improved to 82. Patient has been compliant with lipitor. Denies any acute concerns.  Plan: Repeat lipid panel today Continue atorvastatin 20mg  daily

## 2021-09-16 NOTE — Assessment & Plan Note (Signed)
Flu vaccine administered at this visit.

## 2021-09-16 NOTE — Progress Notes (Signed)
   CC: hypertension follow up  HPI:  [name] is a 55 y.o. female with PMHx as stated below presenting for follow up of her hypertension. She notes intermittent lightheadedness/dizziness associated with hypotension over the past month. Please see problem based charting for complete assessment and plan.  Past Medical History:  Diagnosis Date   Cancer ([place])    skin cancer-basil cell face removed    Hyperlipidemia    Hypertension    Seizures ([place]) 1987-last time   none currently per pt   Varicose veins    Review of Systems:  Negative except as stated in HPI  Physical Exam:  Vitals:   09/16/21 1452  BP: (!) 145/90  Pulse: 74  Temp: 98.4 F (36.9 C)  TempSrc: Oral  SpO2: 100%  Weight: 193 lb 6.4 oz (87.7 kg)   Physical Exam  Constitutional: Appears well-developed and well-nourished. No distress.  HENT: Normocephalic and atraumatic, EOMI, conjunctiva normal, moist mucous membranes Cardiovascular: Normal rate, regular rhythm, S1 and S2 present, no murmurs, rubs, gallops.  Distal pulses intact Respiratory: No respiratory distress,  Lungs are clear to auscultation bilaterally. Musculoskeletal: Normal bulk and tone.  No peripheral edema noted. Neurological: Is alert and oriented x4, no apparent focal deficits noted. Skin: Warm and dry.  No rash, erythema, lesions noted. Psychiatric: Normal mood and affect. Behavior is normal. Judgment and thought content normal.    Assessment & Plan:   See Encounters Tab for problem based charting.  Patient discussed with Dr. Heber Buckingham

## 2021-09-16 NOTE — Patient Instructions (Addendum)
Ms Lindsey Burnett,  It was a pleasure seeing you in clinic. Today we discussed:   Hypertension:  At this time, please start amlodipine-benazepril 5-10mg  combination pill daily  You may discontinue the amlodipine 10mg  and lisinopril 10mg  daily dosings Please monitor your blood pressure at home and bring a log of this to your next visit.  Please follow up in 4-6 weeks for BP check   Cholesterol: I am also checking on your lipid panel at this visit. I will call you with any abnormal results  You received the flu vaccine today  If you have any questions or concerns, please call our clinic at [phone] between 9am-5pm and after hours call [phone] and ask for the internal medicine resident on call. If you feel you are having a medical emergency please call 911.   Thank you, we look forward to helping you remain healthy!

## 2021-09-16 NOTE — Assessment & Plan Note (Addendum)
BP Readings from Last 3 Encounters:  09/16/21 129/79  02/23/21 128/89  11/06/20 109/66   Ms Lindsey Burnett is following up for hypertension. She has been on amlodipine 10mg  daily and lisinopril 10mg  daily and has been tolerating this well. However, does note recent episodes of lightheadedness/dizziness over the past month in which she has noted her SBP to be 100-110's. Denies any headaches, chest pain, shortness of breath or focal weakness. She has been losing some weight and I suspect that her antihypertensive regimen needs have decreased.  At this time, will decrease amlodipine to 5mg  daily and prescribe combination pill to minimize pill burden. She is to continue checking BP at home and follow up in four weeks.  Plan: Start amlodipine-benazepril 5-10mg  daily Discontinued amlodipine 10mg  daily and lisinopril 10mg  daily Continue to monitor BP at home Follow up in 4 weeks for BP check

## 2021-09-17 LAB — LIPID PANEL
Chol/HDL Ratio: 2.8 ratio (ref 0.0–4.4)
Cholesterol, Total: 147 mg/dL (ref 100–199)
HDL: 53 mg/dL (ref >39)
LDL Chol Calc (NIH): 84 mg/dL (ref 0–99)
Triglycerides: 47 mg/dL (ref 0–149)
VLDL Cholesterol Cal: 10 mg/dL (ref 5–40)

## 2021-09-18 ENCOUNTER — Other Ambulatory Visit (HOSPITAL_COMMUNITY): Payer: Self-pay

## 2021-09-18 MED FILL — Atorvastatin Calcium Tab 20 MG (Base Equivalent): ORAL | 30 days supply | Qty: 30 | Fill #5 | Status: AC

## 2021-09-22 NOTE — Progress Notes (Signed)
Internal Medicine Clinic Attending ° °Case discussed with Dr. Aslam  At the time of the visit.  We reviewed the resident’s history and exam and pertinent patient test results.  I agree with the assessment, diagnosis, and plan of care documented in the resident’s note.  °

## 2021-10-19 ENCOUNTER — Encounter: Payer: Self-pay | Admitting: Student

## 2021-10-19 ENCOUNTER — Ambulatory Visit (INDEPENDENT_AMBULATORY_CARE_PROVIDER_SITE_OTHER): Payer: 59 | Admitting: Student

## 2021-10-19 VITALS — BP 134/85 | HR 86 | Temp 98.4°F | Ht 66.5 in | Wt 196.2 lb

## 2021-10-19 DIAGNOSIS — R300 Dysuria: Secondary | ICD-10-CM | POA: Diagnosis not present

## 2021-10-19 DIAGNOSIS — I10 Essential (primary) hypertension: Secondary | ICD-10-CM

## 2021-10-19 DIAGNOSIS — N3001 Acute cystitis with hematuria: Secondary | ICD-10-CM | POA: Diagnosis not present

## 2021-10-19 LAB — POCT URINALYSIS DIPSTICK
Bilirubin, UA: NEGATIVE
Glucose, UA: NEGATIVE
Ketones, UA: NEGATIVE
Nitrite, UA: NEGATIVE
Protein, UA: NEGATIVE
Spec Grav, UA: 1.015 (ref 1.010–1.025)
Urobilinogen, UA: 0.2 E.U./dL
pH, UA: 6.5 (ref 5.0–8.0)

## 2021-10-19 MED ORDER — SULFAMETHOXAZOLE-TRIMETHOPRIM 800-160 MG PO TABS: 1.0000 | ORAL_TABLET | Freq: Two times a day (BID) | ORAL | 0 refills | Status: AC

## 2021-10-19 NOTE — Progress Notes (Signed)
Attestation for Student Documentation: I personally was present and performed or re-performed the history, physical exam and medical decision-making activities of this service and have verified that the service and findings are accurately documented in the student's note.  In short, patient is a 55 y/o patient living with hypertension and HLD. During prior visit she was transition from individual ACE and amlodipine to combination pill. Her dose was decreased secondary to symptomatic hypotension. She denies feeling lightheaded or dizzy and has done well with the combination pill.   She has also endorsed burning with urination and increased in frequency. She has a history of recurrent acute simple cystitis and had successful treatment with bactrim. Will treat for 3 days DS bactrim BID. If no improvement of symptoms will have patient return and obtain UA as well as urine cultures. Do not suspect pyelonephritis or complicated UTI.   Riesa Pope, MD 10/19/2021, 7:42 PM

## 2021-10-19 NOTE — Assessment & Plan Note (Addendum)
Reports UTI symptoms that began with discomfort on Friday. Her symptoms are consistent with previous instances of UTI, which she says occur 3-4x/year. UA equivocal with 1+ leukocytes and trace RBCs, but negative nitrites. Symptoms consistent with classification of recurrent acute uncomplicated cystitis. She was previously successfully treated with bactrim, so will plan to treat with 3 days of bactrim and follow-up as needed if symptoms do not improve or worsen. - Bactrim DS BID x3 days

## 2021-10-19 NOTE — Patient Instructions (Signed)
Thank you, Ms.Earnstine Regal for allowing [place] to provide your care today. Today we discussed:  Blood pressure: Your blood pressure is well-controlled on your current medication. Keep up the good work.  UTI Symptoms: Your symptoms are consistent with an acute UTI. We have prescribed a 3-day course of bactrim for you. If your symptoms worsen or fail to improve, call our office to schedule an appointment. If you experience any symptoms of fever or back pain, call our office.  I have ordered the following labs for you:   Lab Orders         POCT Urinalysis Dipstick (83151)       I have ordered the following medication/changed the following medications:   Start the following medications: Meds ordered this encounter  Medications   sulfamethoxazole-trimethoprim (BACTRIM DS) 800-160 MG tablet    Sig: Take 1 tablet by mouth 2 (two) times daily for 3 days.    Dispense:  6 tablet    Refill:  0     Follow up:  As needed if no improvement or symptoms worsen.    Remember: Should you have any questions or concerns please call the internal medicine clinic at [phone].    Sanjuana Letters, D.O. [place]

## 2021-10-19 NOTE — Progress Notes (Signed)
  Subjective:     Patient ID: Lindsey Burnett, female   DOB: 11/26/1965, 55 y.o.   MRN: [id number]  Lindsey Burnett is a 55 y/o who presents to clinic for blood pressure follow-up. She reports taking blood pressures at home with readings in the 102H systolic. She denies any lightheadedness or dizziness following her medication changes.  She also reports UTI symptoms that began with discomfort on Friday. Her symptoms are consistent with prior UTIs, which she said occur 3-4x/year. She has no new sexual partners, no back pain, or fevers. She endorses chills, dysuria, urgency, and frequency. In the past she has taken Bactrim.  Review of Systems  Constitutional:  Positive for chills. Negative for fever.  Genitourinary:  Positive for dysuria, frequency and urgency. Negative for flank pain.      Objective:   Physical Exam Constitutional:      Appearance: Normal appearance.  HENT:     Head: Normocephalic and atraumatic.  Cardiovascular:     Rate and Rhythm: Normal rate and regular rhythm.     Heart sounds: Normal heart sounds.  Pulmonary:     Effort: Pulmonary effort is normal.     Breath sounds: Normal breath sounds.  Abdominal:     General: Abdomen is flat.     Palpations: Abdomen is soft.     Tenderness: There is no abdominal tenderness. There is no right CVA tenderness or left CVA tenderness.  Neurological:     Mental Status: She is alert.       Assessment & Plan:     See problem-based charting.

## 2021-10-19 NOTE — Assessment & Plan Note (Signed)
Blood pressure well-controlled today at 134/85. She reports measuring her blood pressures regularly at home with readings usually in the 630Z systolic.  Medication recently changed to amlodipine-benazepril and she reports no dizziness or lightheadedness. Will plan to continue this regimen.  - Continue amlodipine-benazepril 5-10mg 

## 2021-10-23 NOTE — Progress Notes (Signed)
Internal Medicine Clinic Attending  Case discussed with Dr. Katsadouros  At the time of the visit.  We reviewed the resident's history and exam and pertinent patient test results.  I agree with the assessment, diagnosis, and plan of care documented in the resident's note.  

## 2021-10-26 ENCOUNTER — Other Ambulatory Visit (HOSPITAL_COMMUNITY): Payer: Self-pay

## 2021-11-17 ENCOUNTER — Other Ambulatory Visit: Payer: Self-pay

## 2021-11-17 MED ORDER — AMLODIPINE BESY-BENAZEPRIL HCL 5-10 MG PO CAPS: 1.0000 | ORAL_CAPSULE | Freq: Every day | ORAL | 3 refills | Status: DC

## 2022-01-18 ENCOUNTER — Other Ambulatory Visit: Payer: Self-pay

## 2022-01-18 DIAGNOSIS — I10 Essential (primary) hypertension: Secondary | ICD-10-CM

## 2022-01-18 MED ORDER — ATORVASTATIN CALCIUM 20 MG PO TABS: ORAL_TABLET | Freq: Every day | ORAL | 3 refills | Status: DC

## 2022-03-03 ENCOUNTER — Ambulatory Visit (INDEPENDENT_AMBULATORY_CARE_PROVIDER_SITE_OTHER): Payer: PRIVATE HEALTH INSURANCE | Admitting: Internal Medicine

## 2022-03-03 DIAGNOSIS — I10 Essential (primary) hypertension: Secondary | ICD-10-CM

## 2022-03-03 NOTE — Progress Notes (Signed)
Patient was not seen today. She is rescheduled for 5/9.  ?

## 2022-03-03 NOTE — Patient Instructions (Signed)
Ms Concettina Leth ? ?It was a pleasure seeing you in clinic. Today we discussed:  ? ?Blood pressure: ? ?Weight loss: ? ?Healthcare maintenance: ?Please visit your local pharmacy at your earliest convenience for the shingles vaccine.  ? ?If you have any questions or concerns, please call our clinic at [phone] between 9am-5pm and after hours call [phone] and ask for the internal medicine resident on call. If you feel you are having a medical emergency please call 911.  ? ?Thank you, we look forward to helping you remain healthy! ? ? ? ?

## 2022-03-30 ENCOUNTER — Other Ambulatory Visit: Payer: Self-pay

## 2022-03-30 ENCOUNTER — Ambulatory Visit (INDEPENDENT_AMBULATORY_CARE_PROVIDER_SITE_OTHER): Payer: 59 | Admitting: Internal Medicine

## 2022-03-30 ENCOUNTER — Encounter: Payer: Self-pay | Admitting: Internal Medicine

## 2022-03-30 VITALS — BP 122/72 | HR 78 | Temp 98.2°F | Resp 24 | Ht 66.5 in | Wt 202.1 lb

## 2022-03-30 DIAGNOSIS — E785 Hyperlipidemia, unspecified: Secondary | ICD-10-CM | POA: Diagnosis not present

## 2022-03-30 DIAGNOSIS — Z6832 Body mass index (BMI) 32.0-32.9, adult: Secondary | ICD-10-CM | POA: Diagnosis not present

## 2022-03-30 DIAGNOSIS — R5382 Chronic fatigue, unspecified: Secondary | ICD-10-CM

## 2022-03-30 DIAGNOSIS — E669 Obesity, unspecified: Secondary | ICD-10-CM

## 2022-03-30 DIAGNOSIS — R21 Rash and other nonspecific skin eruption: Secondary | ICD-10-CM

## 2022-03-30 DIAGNOSIS — I10 Essential (primary) hypertension: Secondary | ICD-10-CM

## 2022-03-30 NOTE — Patient Instructions (Addendum)
Ms Emmersyn Kratzke, ? ?It was a pleasure seeing you in clinic. Today we discussed:  ? ?Rash: ?You may continue using the triamcinoclone cream to help with the itching. I am putting in a referral to the dermatologist for further evaluation ? ?Hypertension: ?Continue taking your medications. Your blood pressure looks great today! ? ?Weight gain/fatigue: ?I am checking on some labs today and will call you with any abnormal results and further discussion.  ? ?If you have any questions or concerns, please call our clinic at [phone] between 9am-5pm and after hours call [phone] and ask for the internal medicine resident on call. If you feel you are having a medical emergency please call 911.  ? ?Thank you, we look forward to helping you remain healthy! ? ? ? ?

## 2022-03-30 NOTE — Progress Notes (Signed)
? ?  CC: hypertension follow up, diffuse rash ? ?HPI: ? ?Lindsey Burnett is a 56 y.o. female with PMHx as stated below presenting for follow up of her hypertension, diffuse "rash" for several weeks.  Please see problem based charting for complete assessment and plan. ? ?Past Medical History:  ?Diagnosis Date  ? Cancer [facility])   ? skin cancer-basil cell face removed   ? Hyperlipidemia   ? Hypertension   ? Seizures ([place]) 1987-last time  ? none currently per pt  ? Varicose veins   ? ?Review of Systems:  Negative except as stated in HPI. ? ?Physical Exam: ? ?Vitals:  ? 03/30/22 1606  ?BP: 122/72  ?Pulse: 78  ?Resp: (!) 24  ?Temp: 98.2 ?F (36.8 ?C)  ?TempSrc: Oral  ?SpO2: 100%  ?Weight: 202 lb 1.6 oz (91.7 kg)  ?Height: 5' 6.5" (1.689 m)  ? ?Physical Exam  ?Constitutional: Appears well-developed and well-nourished. No distress.  ?Cardiovascular: Normal rate, regular rhythm, S1 and S2 present, no murmurs, rubs, gallops.  Distal pulses intact ?Respiratory: Effort normal on room air. Lungs are clear to auscultation bilaterally. ?Musculoskeletal: Normal bulk and tone.  No peripheral edema noted. ?Neurological: Is alert and oriented x4, no apparent focal deficits noted. ?Skin: Warm and dry.  Diffuse lesions on bilateral upper and lower extremtiies with largest being approximately 1cm of hyperpigmentation without surrounding erythema  ?Psychiatric: Normal mood and affect ? ?Assessment & Plan:  ? ?See Encounters Tab for problem based charting. ? ?Patient discussed with Dr. Dareen Piano ? ?

## 2022-03-31 LAB — BMP8+ANION GAP
Anion Gap: 13 mmol/L (ref 10.0–18.0)
BUN/Creatinine Ratio: 12 (ref 9–23)
BUN: 9 mg/dL (ref 6–24)
CO2: 25 mmol/L (ref 20–29)
Calcium: 9.3 mg/dL (ref 8.7–10.2)
Chloride: 103 mmol/L (ref 96–106)
Creatinine, Ser: 0.75 mg/dL (ref 0.57–1.00)
Glucose: 80 mg/dL (ref 70–99)
Potassium: 4.5 mmol/L (ref 3.5–5.2)
Sodium: 141 mmol/L (ref 134–144)
eGFR: 92 mL/min/{1.73_m2} (ref >59)

## 2022-03-31 LAB — LIPID PANEL
Chol/HDL Ratio: 2.7 ratio (ref 0.0–4.4)
Cholesterol, Total: 149 mg/dL (ref 100–199)
HDL: 55 mg/dL (ref >39)
LDL Chol Calc (NIH): 80 mg/dL (ref 0–99)
Triglycerides: 69 mg/dL (ref 0–149)
VLDL Cholesterol Cal: 14 mg/dL (ref 5–40)

## 2022-03-31 LAB — TSH: TSH: 1.2 u[IU]/mL (ref 0.450–4.500)

## 2022-03-31 LAB — HEMOGLOBIN A1C
Est. average glucose Bld gHb Est-mCnc: 108 mg/dL
Hgb A1c MFr Bld: 5.4 % (ref 4.8–5.6)

## 2022-04-03 DIAGNOSIS — E669 Obesity, unspecified: Secondary | ICD-10-CM | POA: Insufficient documentation

## 2022-04-03 DIAGNOSIS — R21 Rash and other nonspecific skin eruption: Secondary | ICD-10-CM | POA: Insufficient documentation

## 2022-04-03 MED ORDER — EUCERIN EX CREA: TOPICAL_CREAM | Freq: Three times a day (TID) | CUTANEOUS | 0 refills | Status: AC | PRN

## 2022-04-03 NOTE — Assessment & Plan Note (Signed)
Patient with trouble loosing weight and fatigue. BMI elevated to 32. TSH level wnl. Patient expresses desire for weight loss. A1c 5.4, would not qualify for GLP-1 agonist.  ? ?Plan: ?Referral to weight management clinic ?

## 2022-04-03 NOTE — Assessment & Plan Note (Addendum)
Patient reports several weeks of diffuse skin eruptions of bilateral upper and lower extremities with associated urticaria. She has dark hyperpigmented spots, with the largest being approximately 1cm without surrounding erythema. She denies any similar symptoms previously. She reports that she has tried topical creams for psoriasis without improvement in symptoms. Unclear etiology of patient's symptoms . ? ?Plan: ?Triamcinolone cream  ?Referral to dermatology ?

## 2022-04-03 NOTE — Assessment & Plan Note (Signed)
BP Readings from Last 3 Encounters:  ?03/30/22 122/72  ?10/19/21 134/85  ?09/16/21 129/79  ? ?Patient's blood pressure is well controlled on current regimen. She denies any headache, lightheadedness/dizziness, chest pain, shortness of breath or focal weakness. BMP at this visit with stable renal function and no electrolyte dysfunction ? ?Plan: ?Continue amlodipine-benazepril 5-'10mg'$  daily  ?Follow up in 6 months  ?

## 2022-04-03 NOTE — Assessment & Plan Note (Signed)
Lipid Panel  ?   ?Component Value Date/Time  ? CHOL 149 03/30/2022 1700  ? TRIG 69 03/30/2022 1700  ? HDL 55 03/30/2022 1700  ? CHOLHDL 2.7 03/30/2022 1700  ? Fidelity 80 03/30/2022 1700  ? LABVLDL 14 03/30/2022 1700  ? ?LDL at goal of <100 on Lipitor '20mg'$  daily ? ?Plan: ?Continue current regimen ?

## 2022-04-05 NOTE — Progress Notes (Signed)
Internal Medicine Clinic Attending ° °Case discussed with Dr. Aslam  At the time of the visit.  We reviewed the resident’s history and exam and pertinent patient test results.  I agree with the assessment, diagnosis, and plan of care documented in the resident’s note.  °

## 2022-05-19 DIAGNOSIS — L299 Pruritus, unspecified: Secondary | ICD-10-CM | POA: Diagnosis not present

## 2022-05-19 DIAGNOSIS — L300 Nummular dermatitis: Secondary | ICD-10-CM | POA: Diagnosis not present

## 2022-07-21 DIAGNOSIS — Z0289 Encounter for other administrative examinations: Secondary | ICD-10-CM

## 2022-07-22 ENCOUNTER — Other Ambulatory Visit: Payer: Self-pay | Admitting: Student

## 2022-07-22 ENCOUNTER — Other Ambulatory Visit: Payer: Self-pay | Admitting: Internal Medicine

## 2022-07-22 DIAGNOSIS — Z1231 Encounter for screening mammogram for malignant neoplasm of breast: Secondary | ICD-10-CM

## 2022-07-29 DIAGNOSIS — J069 Acute upper respiratory infection, unspecified: Secondary | ICD-10-CM | POA: Diagnosis not present

## 2022-08-03 ENCOUNTER — Ambulatory Visit (INDEPENDENT_AMBULATORY_CARE_PROVIDER_SITE_OTHER): Payer: 59 | Admitting: Family Medicine

## 2022-08-03 ENCOUNTER — Encounter (INDEPENDENT_AMBULATORY_CARE_PROVIDER_SITE_OTHER): Payer: Self-pay | Admitting: Family Medicine

## 2022-08-03 VITALS — BP 142/90 | HR 67 | Temp 98.0°F | Ht 64.0 in | Wt 199.0 lb

## 2022-08-03 DIAGNOSIS — E669 Obesity, unspecified: Secondary | ICD-10-CM | POA: Diagnosis not present

## 2022-08-03 DIAGNOSIS — Z72820 Sleep deprivation: Secondary | ICD-10-CM | POA: Diagnosis not present

## 2022-08-03 DIAGNOSIS — Z1331 Encounter for screening for depression: Secondary | ICD-10-CM

## 2022-08-03 DIAGNOSIS — E7849 Other hyperlipidemia: Secondary | ICD-10-CM | POA: Diagnosis not present

## 2022-08-03 DIAGNOSIS — I10 Essential (primary) hypertension: Secondary | ICD-10-CM | POA: Diagnosis not present

## 2022-08-03 DIAGNOSIS — R0602 Shortness of breath: Secondary | ICD-10-CM | POA: Diagnosis not present

## 2022-08-03 DIAGNOSIS — R5383 Other fatigue: Secondary | ICD-10-CM | POA: Diagnosis not present

## 2022-08-03 DIAGNOSIS — Z6834 Body mass index (BMI) 34.0-34.9, adult: Secondary | ICD-10-CM

## 2022-08-05 LAB — HEMOGLOBIN A1C
Est. average glucose Bld gHb Est-mCnc: 108 mg/dL
Hgb A1c MFr Bld: 5.4 % (ref 4.8–5.6)

## 2022-08-05 LAB — CBC WITH DIFFERENTIAL/PLATELET
Basophils Absolute: 0 10*3/uL (ref 0.0–0.2)
Basos: 0 %
EOS (ABSOLUTE): 0 10*3/uL (ref 0.0–0.4)
Eos: 0 %
Hematocrit: 37.7 % (ref 34.0–46.6)
Hemoglobin: 12.5 g/dL (ref 11.1–15.9)
Immature Grans (Abs): 0 10*3/uL (ref 0.0–0.1)
Immature Granulocytes: 0 %
Lymphocytes Absolute: 2.8 10*3/uL (ref 0.7–3.1)
Lymphs: 26 %
MCH: 28.8 pg (ref 26.6–33.0)
MCHC: 33.2 g/dL (ref 31.5–35.7)
MCV: 87 fL (ref 79–97)
Monocytes Absolute: 0.8 10*3/uL (ref 0.1–0.9)
Monocytes: 7 %
Neutrophils Absolute: 7.1 10*3/uL — ABNORMAL HIGH (ref 1.4–7.0)
Neutrophils: 67 %
Platelets: 296 10*3/uL (ref 150–450)
RBC: 4.34 x10E6/uL (ref 3.77–5.28)
RDW: 13 % (ref 11.7–15.4)
WBC: 10.8 10*3/uL (ref 3.4–10.8)

## 2022-08-05 LAB — COMPREHENSIVE METABOLIC PANEL
ALT: 30 IU/L (ref 0–32)
AST: 23 IU/L (ref 0–40)
Albumin/Globulin Ratio: 1.4 (ref 1.2–2.2)
Albumin: 4 g/dL (ref 3.8–4.9)
Alkaline Phosphatase: 134 IU/L — ABNORMAL HIGH (ref 44–121)
BUN/Creatinine Ratio: 15 (ref 9–23)
BUN: 13 mg/dL (ref 6–24)
Bilirubin Total: 0.4 mg/dL (ref 0.0–1.2)
CO2: 26 mmol/L (ref 20–29)
Calcium: 9.3 mg/dL (ref 8.7–10.2)
Chloride: 106 mmol/L (ref 96–106)
Creatinine, Ser: 0.84 mg/dL (ref 0.57–1.00)
Globulin, Total: 2.9 g/dL (ref 1.5–4.5)
Glucose: 86 mg/dL (ref 70–99)
Potassium: 4.4 mmol/L (ref 3.5–5.2)
Sodium: 146 mmol/L — ABNORMAL HIGH (ref 134–144)
Total Protein: 6.9 g/dL (ref 6.0–8.5)
eGFR: 80 mL/min/{1.73_m2} (ref >59)

## 2022-08-05 LAB — VITAMIN B12: Vitamin B-12: 592 pg/mL (ref 232–1245)

## 2022-08-05 LAB — VITAMIN D 25 HYDROXY (VIT D DEFICIENCY, FRACTURES): Vit D, 25-Hydroxy: 17.1 ng/mL — ABNORMAL LOW (ref 30.0–100.0)

## 2022-08-05 LAB — INSULIN, RANDOM: INSULIN: 7.7 u[IU]/mL (ref 2.6–24.9)

## 2022-08-05 LAB — FOLATE: Folate: 6.3 ng/mL (ref >3.0)

## 2022-08-09 NOTE — Progress Notes (Unsigned)
Chief Complaint:   OBESITY Lindsey Burnett (MR# [id number]) is a 56 y.o. female who presents for evaluation and treatment of obesity and related comorbidities. Current BMI is Body mass index is 34.16 kg/m. Lindsey Burnett has been struggling with her weight for many years and has been unsuccessful in either losing weight, maintaining weight loss, or reaching her healthy weight goal.  Has been overweight since childhood with positive family history of obesity.  Has lost up to 50 lbs with weight watchers.  Sister died recently with leukemia and now she is taking care of her dad.  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  Struggles with emotional eating, meal skipping.  Lacks adequate sleep.  Never used AOM's.   Lindsey Burnett is currently in the action stage of change and ready to dedicate time achieving and maintaining a healthier weight. Lindsey Burnett is interested in becoming our patient and working on intensive lifestyle modifications including (but not limited to) diet and exercise for weight loss.  Quintara's habits were reviewed today and are as follows: Her family eats meals together, she thinks her family will eat healthier with her, her desired weight loss is 39 lbs, she has been heavy most of her life, she started gaining weight, has been up and down her entire life, her heaviest weight ever was 232 pounds, she has significant food cravings issues, she skips meals frequently, she is frequently drinking liquids with calories, she frequently eats larger portions than normal, and she struggles with emotional eating.  Depression Screen Lindsey Burnett's Food and Mood (modified PHQ-9) score was 5.     08/03/2022    7:36 AM  Depression screen PHQ 2/9  Decreased Interest 1  Down, Depressed, Hopeless 1  PHQ - 2 Score 2  Altered sleeping 0  Tired, decreased energy 2  Change in appetite 1  Feeling bad or failure about yourself  0  Trouble concentrating 0  Moving slowly or fidgety/restless 0  Suicidal thoughts 0  PHQ-9  Score 5  Difficult doing work/chores Not difficult at all   Subjective:   1. Other fatigue Lindsey Burnett admits to daytime somnolence and admits to waking up still tired. Patient has a history of symptoms of daytime fatigue, morning fatigue, and morning headache. Lindsey Burnett generally gets 6 hours of sleep per night, and states that she has nightime awakenings. Snoring is present. Apneic episodes are not present. Epworth Sleepiness Score is 6.   2. SOBOE (shortness of breath on exertion) Lindsey Burnett notes increasing shortness of breath with exercising and seems to be worsening over time with weight gain. She notes getting out of breath sooner with activity than she used to. This has not gotten worse recently. Lindsey Burnett denies shortness of breath at rest or orthopnea.  3. Essential hypertension Blood pressure elevated today on Amlodipine/Benazepril 5-10 mg daily. Denies chest pain.   4. Other hyperlipidemia On atorvastatin 20 mg daily.  03/30/22 FLP at goal.   5. Poor sleep Getting 5-6 hours of sleep at night, having hot flashes at night.  Denies snoring.   Assessment/Plan:   1. Other fatigue Lindsey Burnett does feel that her weight is causing her energy to be lower than it should be. Fatigue may be related to obesity, depression or many other causes. Labs will be ordered, and in the meanwhile, Lindsey Burnett will focus on self care including making healthy food choices, increasing physical activity and focusing on stress reduction.  - EKG 12-Lead - VITAMIN D 25 Hydroxy (Vit-D Deficiency, Fractures) - Insulin, random -  Hemoglobin A1c - Folate - Comprehensive metabolic panel - Vitamin U20 - CBC with Differential/Platelet  2. SOBOE (shortness of breath on exertion) Lindsey Burnett does feel that she gets out of breath more easily that she used to when she exercises. Lindsey Burnett's shortness of breath appears to be obesity related and exercise induced. She has agreed to work on weight loss and gradually increase exercise to treat her exercise  induced shortness of breath. Will continue to monitor closely.  3. Essential hypertension Continue current blood pressure medications per PCP.  Look for improvements with weight loss.    4. Other hyperlipidemia Continue atorvastatin 20 mg daily.  Look for improvements with weight loss.    5. Poor sleep Will work on improving sleep time to 8 hours at night.    6. Depression screening Lindsey Burnett had a positive depression screening. Depression is commonly associated with obesity and often results in emotional eating behaviors. We will monitor this closely and work on CBT to help improve the non-hunger eating patterns. Referral to Psychology may be required if no improvement is seen as she continues in our clinic.  7. Obesity, current BMI 34.2 Lindsey Burnett is currently in the action stage of change and her goal is to continue with weight loss efforts. I recommend Lindsey Burnett begin the structured treatment plan as follows:  She has agreed to the Category 1 Plan.  Exercise goals:  Start tracking steps.     Behavioral modification strategies: increasing lean protein intake, increasing water intake, decreasing liquid calories, decreasing eating out, no skipping meals, meal planning and cooking strategies, better snacking choices, and decreasing junk food.  She was informed of the importance of frequent follow-up visits to maximize her success with intensive lifestyle modifications for her multiple health conditions. She was informed we would discuss her lab results at her next visit unless there is a critical issue that needs to be addressed sooner. Lindsey Burnett agreed to keep her next visit at the agreed upon time to discuss these results.  Objective:   Blood pressure (!) 142/90, pulse 67, temperature 98 F (36.7 C), height '5\' 4"'$  (1.626 m), weight 199 lb (90.3 kg), SpO2 100 %. Body mass index is 34.16 kg/m.  EKG: Normal sinus rhythm, rate 68 BPM.  Indirect Calorimeter completed today shows a VO2 of 185 and a REE of  1267.  Her calculated basal metabolic rate is 2542 thus her basal metabolic rate is worse than expected.  General: Cooperative, alert, well developed, in no acute distress. HEENT: Conjunctivae and lids unremarkable. Cardiovascular: Regular rhythm.  Lungs: Normal work of breathing. Neurologic: No focal deficits.   Lab Results  Component Value Date   CREATININE 0.84 08/03/2022   BUN 13 08/03/2022   NA 146 (H) 08/03/2022   K 4.4 08/03/2022   CL 106 08/03/2022   CO2 26 08/03/2022   Lab Results  Component Value Date   ALT 30 08/03/2022   AST 23 08/03/2022   ALKPHOS 134 (H) 08/03/2022   BILITOT 0.4 08/03/2022   Lab Results  Component Value Date   HGBA1C 5.4 08/03/2022   HGBA1C 5.4 03/30/2022   HGBA1C 5.4 10/17/2019   Lab Results  Component Value Date   INSULIN 7.7 08/03/2022   Lab Results  Component Value Date   TSH 1.200 03/30/2022   Lab Results  Component Value Date   CHOL 149 03/30/2022   HDL 55 03/30/2022   LDLCALC 80 03/30/2022   TRIG 69 03/30/2022   CHOLHDL 2.7 03/30/2022   Lab Results  Component  Value Date   WBC 10.8 08/03/2022   HGB 12.5 08/03/2022   HCT 37.7 08/03/2022   MCV 87 08/03/2022   PLT 296 08/03/2022   No results found for: "IRON", "TIBC", "FERRITIN"  Attestation Statements:   Reviewed by clinician on day of visit: allergies, medications, problem list, medical history, surgical history, family history, social history, and previous encounter notes.  I, Davy Pique, am acting as Location manager for Loyal Gambler, DO.  I have reviewed the above documentation for accuracy and completeness, and I agree with the above. Dell Ponto, DO

## 2022-08-17 ENCOUNTER — Encounter (INDEPENDENT_AMBULATORY_CARE_PROVIDER_SITE_OTHER): Payer: Self-pay | Admitting: Family Medicine

## 2022-08-17 ENCOUNTER — Ambulatory Visit (INDEPENDENT_AMBULATORY_CARE_PROVIDER_SITE_OTHER): Payer: 59 | Admitting: Family Medicine

## 2022-08-17 VITALS — BP 127/84 | HR 79 | Temp 98.3°F | Ht 64.0 in | Wt 190.0 lb

## 2022-08-17 DIAGNOSIS — E559 Vitamin D deficiency, unspecified: Secondary | ICD-10-CM | POA: Diagnosis not present

## 2022-08-17 DIAGNOSIS — K5909 Other constipation: Secondary | ICD-10-CM

## 2022-08-17 DIAGNOSIS — R748 Abnormal levels of other serum enzymes: Secondary | ICD-10-CM

## 2022-08-17 DIAGNOSIS — Z6832 Body mass index (BMI) 32.0-32.9, adult: Secondary | ICD-10-CM

## 2022-08-17 DIAGNOSIS — E669 Obesity, unspecified: Secondary | ICD-10-CM

## 2022-08-17 DIAGNOSIS — R232 Flushing: Secondary | ICD-10-CM | POA: Diagnosis not present

## 2022-08-17 MED ORDER — VITAMIN D (ERGOCALCIFEROL) 1.25 MG (50000 UNIT) PO CAPS: 50000.0000 [IU] | ORAL_CAPSULE | ORAL | 0 refills | Status: DC

## 2022-08-18 IMAGING — MG MM DIGITAL SCREENING BILAT W/ TOMO AND CAD
6 of 10 series · 6 of 30 positions shown · non-contrast
Comparison: Previous exam(s).

CLINICAL DATA: Screening.

EXAM:
DIGITAL SCREENING BILATERAL MAMMOGRAM WITH TOMOSYNTHESIS AND CAD
TECHNIQUE: Bilateral screening digital craniocaudal and mediolateral oblique
mammograms were obtained. Bilateral screening digital breast
tomosynthesis was performed. The images were evaluated with
computer-aided detection.

[L CC synth-2D]
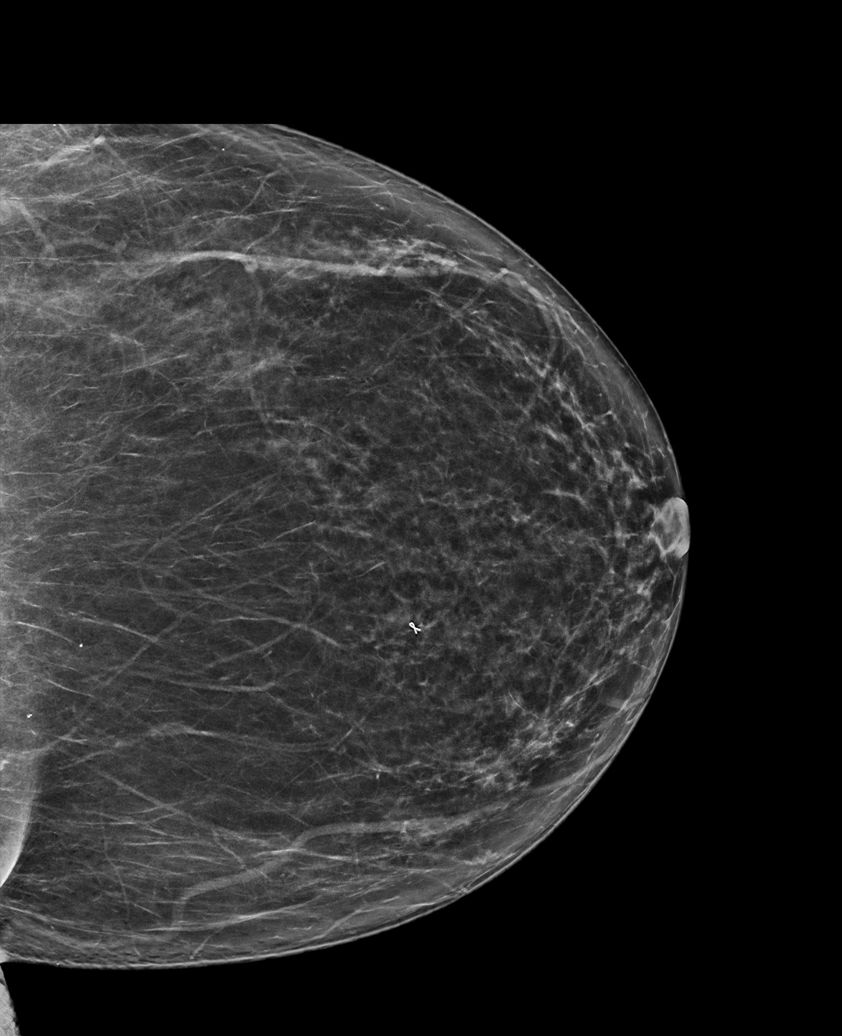

[R MLO synth-2D (1 of 2)]
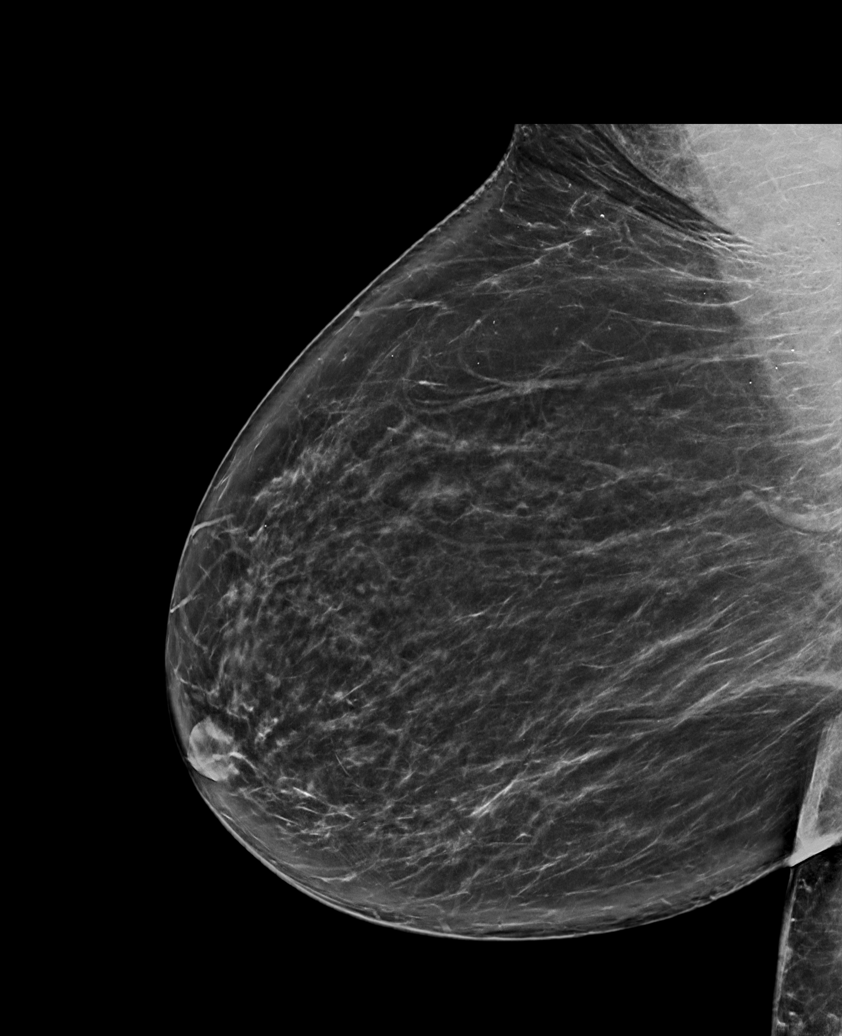

[R MLO synth-2D (2 of 2)]
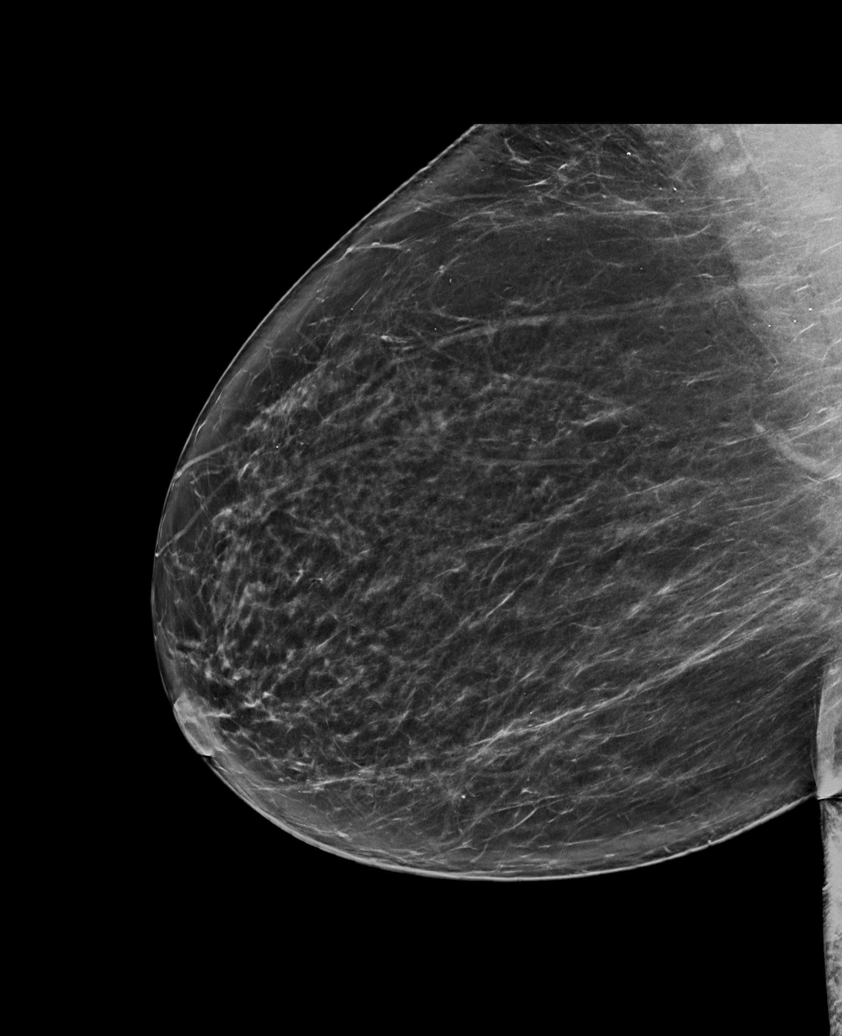

[L MLO synth-2D]
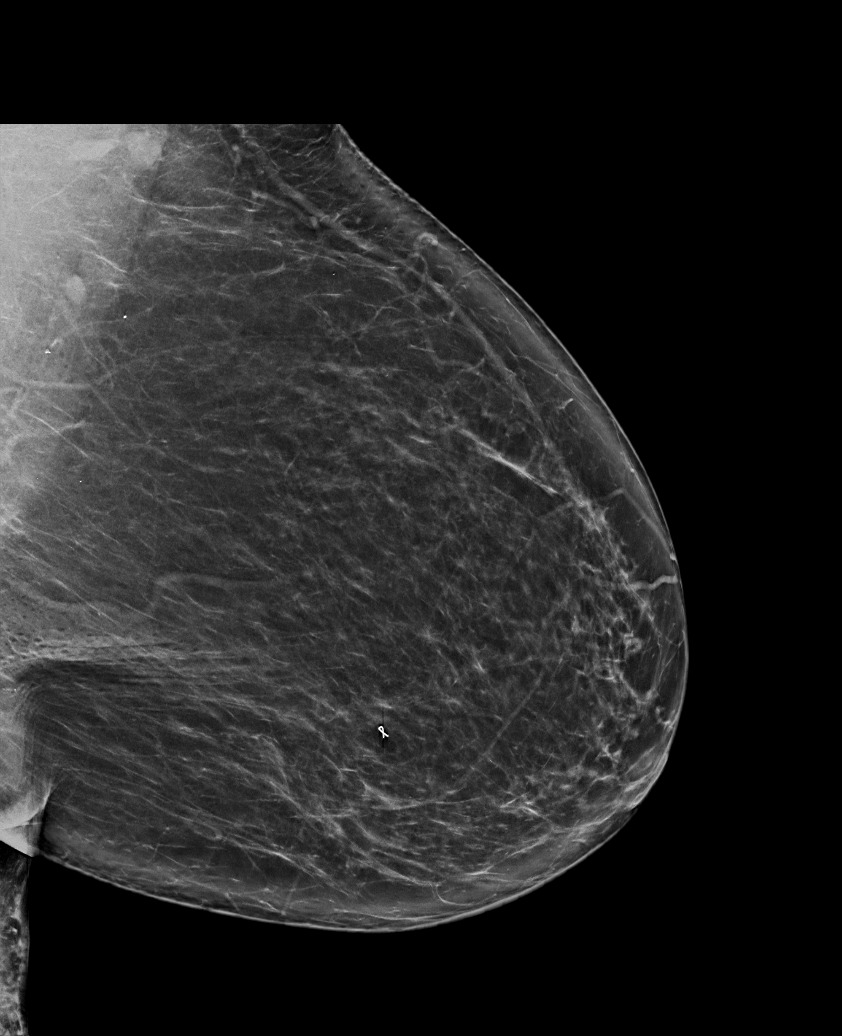

[R CC synth-2D]
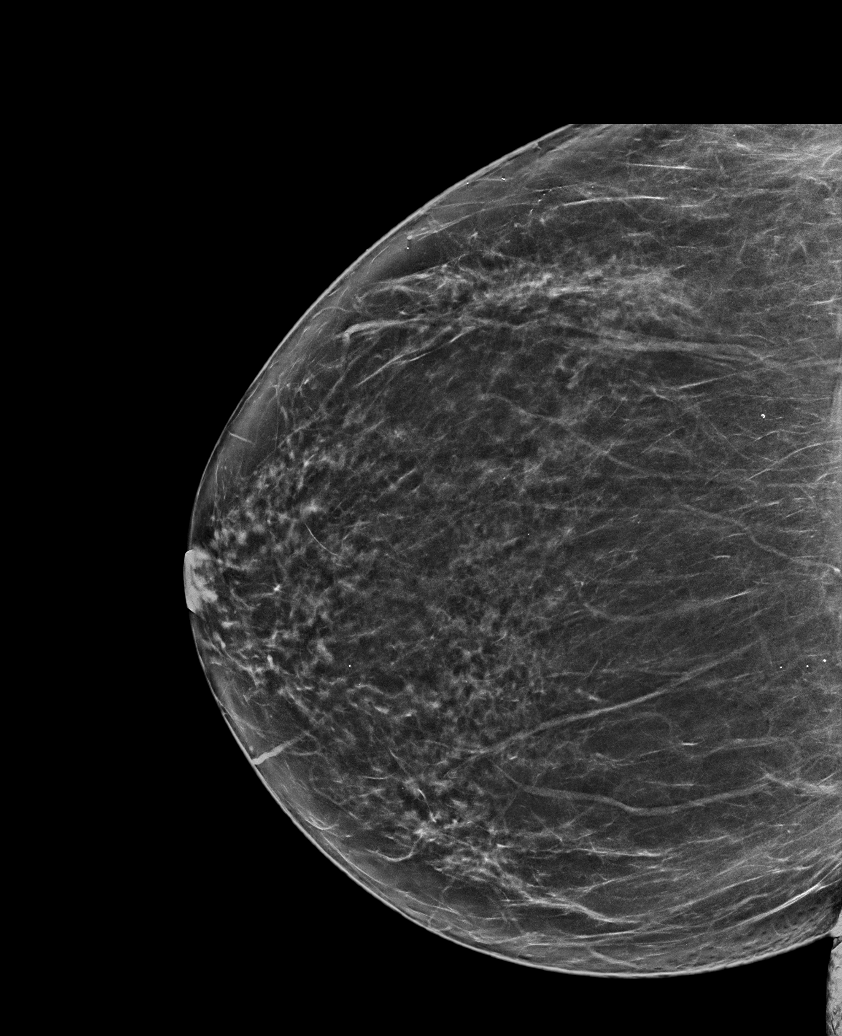

[R MLO tomo · tomo slice 45/90.0]
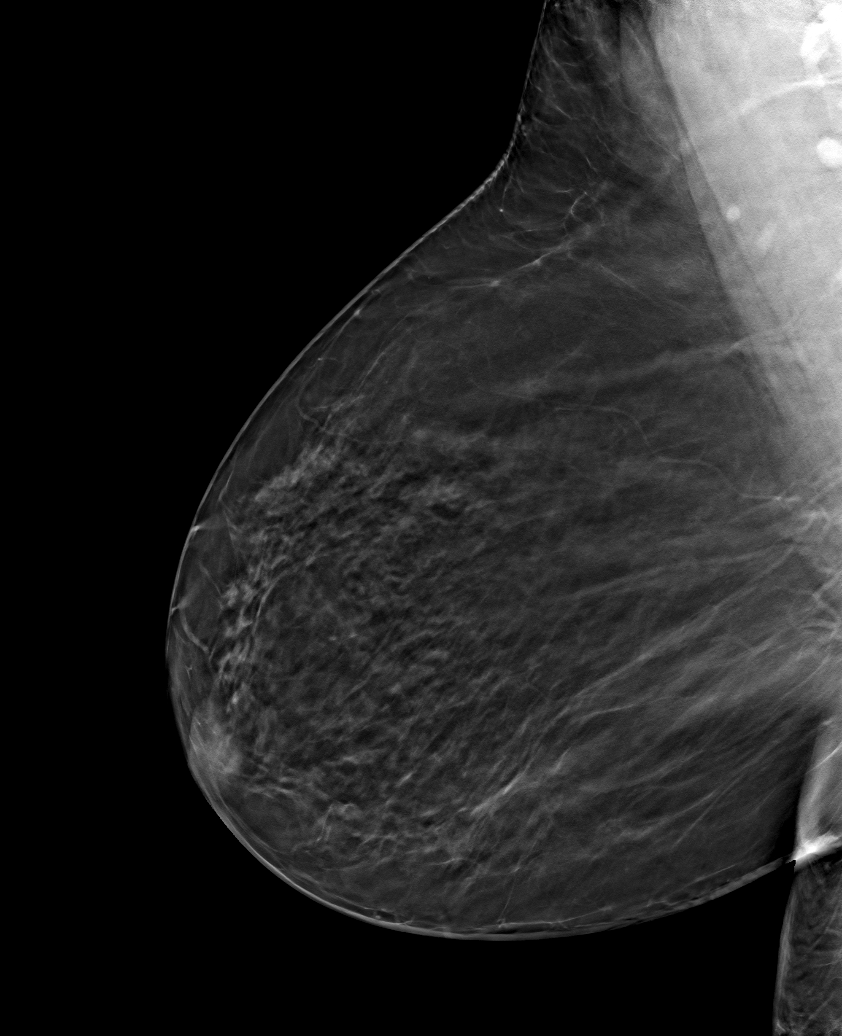

[6 of 30 positions shown; findings below may reference images not displayed]

ACR Breast Density Category b: There are scattered areas of
fibroglandular density.
FINDINGS: There are no findings suspicious for malignancy.
IMPRESSION: No mammographic evidence of malignancy. A result letter of this
screening mammogram will be mailed directly to the patient.

RECOMMENDATION:
Screening mammogram in one year. (Code:51-O-LD2)

BI-RADS CATEGORY  1: Negative.

## 2022-08-19 NOTE — Progress Notes (Signed)
Chief Complaint:   OBESITY Lindsey Burnett is here to discuss her progress with her obesity treatment plan along with follow-up of her obesity related diagnoses. Lindsey Burnett is on the Category 1 Plan and states she is following her eating plan approximately 90% of the time. Lindsey Burnett states she is not exercising.   Today's visit was #: 2 Starting weight: 199 lbs Starting date: 08/03/2022 Today's weight: 190 lbs Today's date: 08/17/2022 Total lbs lost to date: 9 lbs Total lbs lost since last in-office visit: 9 lbs  Interim History: She is doing well on meal plan and feels full with meals.  She denies hunger or cravings.  She denies meal skipping.  She has been drinking more water.  She is off [facility].  She has not yet added in exercise.   Subjective:   1. Vitamin D deficiency New diagnosis.  Discussed labs with patient today. Vitamin D level 17.1, complains of fatigue.   2. Vasomotor flushing Impaired sleep at night, not on HRT.   3. Elevated alkaline phosphatase level Alkaline phosphatase 134 with normal AST and ALT.  Denies ETOH intake.    4. Other constipation Chronic constipation, worsened with higher protein diet.  Assessment/Plan:   1. Vitamin D deficiency Begin- Vitamin D, Ergocalciferol, (DRISDOL) 1.25 MG (50000 UNIT) CAPS capsule; Take 1 capsule (50,000 Units total) by mouth every 7 (seven) days.  Dispense: 5 capsule; Refill: 0  2. Vasomotor flushing Plans to see OB/GYN to discuss.    3. Elevated alkaline phosphatase level Plans to update DEXA with OB/GYN (post menopausal). Repeat hepatic function panel in 3 months.   4. Other constipation Started Miralax daily.  Increased water intake to >64 oz per day and increase non starchy veggie intake at least 2 cups with dinner.   5. Obesity, current BMI 32.7 1) Eating out guide given.  2) Protein substitutes handout given. 3) Band it handout and bands given.   Lindsey Burnett is currently in the action stage of change. As such, her  goal is to continue with weight loss efforts. She has agreed to the Category 1 Plan.   Exercise goals:  Bands and walk on days off of work.   Behavioral modification strategies: increasing lean protein intake, increasing vegetables, increasing water intake, decreasing eating out, no skipping meals, meal planning and cooking strategies, keeping healthy foods in the home, and decreasing junk food.  Lindsey Burnett has agreed to follow-up with our clinic in 2 weeks. She was informed of the importance of frequent follow-up visits to maximize her success with intensive lifestyle modifications for her multiple health conditions.   Objective:   Blood pressure 127/84, pulse 79, temperature 98.3 F (36.8 C), height '5\' 4"'$  (1.626 m), weight 190 lb (86.2 kg), SpO2 100 %. Body mass index is 32.61 kg/m.  General: Cooperative, alert, well developed, in no acute distress. HEENT: Conjunctivae and lids unremarkable. Cardiovascular: Regular rhythm.  Lungs: Normal work of breathing. Neurologic: No focal deficits.   Lab Results  Component Value Date   CREATININE 0.84 08/03/2022   BUN 13 08/03/2022   NA 146 (H) 08/03/2022   K 4.4 08/03/2022   CL 106 08/03/2022   CO2 26 08/03/2022   Lab Results  Component Value Date   ALT 30 08/03/2022   AST 23 08/03/2022   ALKPHOS 134 (H) 08/03/2022   BILITOT 0.4 08/03/2022   Lab Results  Component Value Date   HGBA1C 5.4 08/03/2022   HGBA1C 5.4 03/30/2022   HGBA1C 5.4 10/17/2019   Lab  Results  Component Value Date   INSULIN 7.7 08/03/2022   Lab Results  Component Value Date   TSH 1.200 03/30/2022   Lab Results  Component Value Date   CHOL 149 03/30/2022   HDL 55 03/30/2022   LDLCALC 80 03/30/2022   TRIG 69 03/30/2022   CHOLHDL 2.7 03/30/2022   Lab Results  Component Value Date   VD25OH 17.1 (L) 08/03/2022   Lab Results  Component Value Date   WBC 10.8 08/03/2022   HGB 12.5 08/03/2022   HCT 37.7 08/03/2022   MCV 87 08/03/2022   PLT 296 08/03/2022    No results found for: "IRON", "TIBC", "FERRITIN"  Attestation Statements:   Reviewed by clinician on day of visit: allergies, medications, problem list, medical history, surgical history, family history, social history, and previous encounter notes.  I, Davy Pique, am acting as Location manager for Loyal Gambler, DO.  I have reviewed the above documentation for accuracy and completeness, and I agree with the above. Dell Ponto, DO

## 2022-08-25 ENCOUNTER — Ambulatory Visit
Admission: RE | Admit: 2022-08-25 | Discharge: 2022-08-25 | Disposition: A | Discharge status: Home / Self Care | Payer: 59 | Source: Ambulatory Visit | Attending: Internal Medicine | Admitting: Internal Medicine

## 2022-08-25 DIAGNOSIS — Z1231 Encounter for screening mammogram for malignant neoplasm of breast: Secondary | ICD-10-CM

## 2022-09-02 DIAGNOSIS — E559 Vitamin D deficiency, unspecified: Secondary | ICD-10-CM | POA: Diagnosis not present

## 2022-09-02 DIAGNOSIS — M25552 Pain in left hip: Secondary | ICD-10-CM | POA: Diagnosis not present

## 2022-09-02 DIAGNOSIS — Z6831 Body mass index (BMI) 31.0-31.9, adult: Secondary | ICD-10-CM | POA: Diagnosis not present

## 2022-09-08 ENCOUNTER — Ambulatory Visit (INDEPENDENT_AMBULATORY_CARE_PROVIDER_SITE_OTHER): Payer: 59 | Admitting: Family Medicine

## 2022-09-08 ENCOUNTER — Encounter (INDEPENDENT_AMBULATORY_CARE_PROVIDER_SITE_OTHER): Payer: Self-pay | Admitting: Family Medicine

## 2022-09-08 VITALS — BP 131/83 | HR 80 | Temp 98.3°F | Ht 64.0 in | Wt 187.0 lb

## 2022-09-08 DIAGNOSIS — R232 Flushing: Secondary | ICD-10-CM

## 2022-09-08 DIAGNOSIS — Z6832 Body mass index (BMI) 32.0-32.9, adult: Secondary | ICD-10-CM

## 2022-09-08 DIAGNOSIS — E669 Obesity, unspecified: Secondary | ICD-10-CM | POA: Diagnosis not present

## 2022-09-08 DIAGNOSIS — E559 Vitamin D deficiency, unspecified: Secondary | ICD-10-CM

## 2022-09-08 MED ORDER — VITAMIN D (ERGOCALCIFEROL) 1.25 MG (50000 UNIT) PO CAPS: 50000.0000 [IU] | ORAL_CAPSULE | ORAL | 0 refills | Status: DC

## 2022-09-16 NOTE — Progress Notes (Signed)
Chief Complaint:   OBESITY Lindsey Burnett is here to discuss her progress with her obesity treatment plan along with follow-up of her obesity related diagnoses. Lindsey Burnett is on the Category 1 Plan and states she is following her eating plan approximately 90% of the time. Lindsey Burnett states she is not exercising.   Today's visit was #: 3 Starting weight: 199 lbs Starting date: 08/03/2022 Today's weight: 187 lbs Today's date: 09/08/2022 Total lbs lost to date: 12 lbs Total lbs lost since last in-office visit: 3 lbs  Interim History: Eating 2-3 meals in a day, skips 3rd meal on work days due to schedule.  She denies hunger or cravings.  Getting in fruits and veggies.  Cut out soda and increased water intake.  Cooking more at home.  Brings protein bars and yogurt to work.  Plans to add in more walking.   Subjective:   1. Vitamin D deficiency Taking Vitamin D 50,000 IU weekly.   2. Vasomotor flushing Sleep is somewhat disrupted, met with OBGYN.  Family history of CVA, high risk for HRT use.   Assessment/Plan:   1. Vitamin D deficiency Refill - Vitamin D, Ergocalciferol, (DRISDOL) 1.25 MG (50000 UNIT) CAPS capsule; Take 1 capsule (50,000 Units total) by mouth every 7 (seven) days.  Dispense: 5 capsule; Refill: 0  2. Vasomotor flushing Continue to work on symptom relief without HRT.   3. Obesity, current BMI 32.1 Firm up plan for walking 30 minutes 3-4 times per week.   Lindsey Burnett is currently in the action stage of change. As such, her goal is to continue with weight loss efforts. She has agreed to the Category 1 Plan.   Exercise goals: All adults should avoid inactivity. Some physical activity is better than none, and adults who participate in any amount of physical activity gain some health benefits.  Behavioral modification strategies: increasing lean protein intake, increasing vegetables, increasing water intake, decreasing liquid calories, no skipping meals, meal planning and cooking strategies,  keeping healthy foods in the home, and decreasing junk food.  Lindsey Burnett has agreed to follow-up with our clinic in 3 weeks. She was informed of the importance of frequent follow-up visits to maximize her success with intensive lifestyle modifications for her multiple health conditions.   Objective:   Blood pressure 131/83, pulse 80, temperature 98.3 F (36.8 C), height 5' 4"  (1.626 m), weight 187 lb (84.8 kg), SpO2 100 %. Body mass index is 32.1 kg/m.  General: Cooperative, alert, well developed, in no acute distress. HEENT: Conjunctivae and lids unremarkable. Cardiovascular: Regular rhythm.  Lungs: Normal work of breathing. Neurologic: No focal deficits.   Lab Results  Component Value Date   CREATININE 0.84 08/03/2022   BUN 13 08/03/2022   NA 146 (H) 08/03/2022   K 4.4 08/03/2022   CL 106 08/03/2022   CO2 26 08/03/2022   Lab Results  Component Value Date   ALT 30 08/03/2022   AST 23 08/03/2022   ALKPHOS 134 (H) 08/03/2022   BILITOT 0.4 08/03/2022   Lab Results  Component Value Date   HGBA1C 5.4 08/03/2022   HGBA1C 5.4 03/30/2022   HGBA1C 5.4 10/17/2019   Lab Results  Component Value Date   INSULIN 7.7 08/03/2022   Lab Results  Component Value Date   TSH 1.200 03/30/2022   Lab Results  Component Value Date   CHOL 149 03/30/2022   HDL 55 03/30/2022   LDLCALC 80 03/30/2022   TRIG 69 03/30/2022   CHOLHDL 2.7 03/30/2022   Lab  Results  Component Value Date   VD25OH 17.1 (L) 08/03/2022   Lab Results  Component Value Date   WBC 10.8 08/03/2022   HGB 12.5 08/03/2022   HCT 37.7 08/03/2022   MCV 87 08/03/2022   PLT 296 08/03/2022   No results found for: "IRON", "TIBC", "FERRITIN"  Attestation Statements:   Reviewed by clinician on day of visit: allergies, medications, problem list, medical history, surgical history, family history, social history, and previous encounter notes.  I, Davy Pique, am acting as Location manager for Loyal Gambler, DO.  I have  reviewed the above documentation for accuracy and completeness, and I agree with the above. Dell Ponto, DO

## 2022-09-21 DIAGNOSIS — R87610 Atypical squamous cells of undetermined significance on cytologic smear of cervix (ASC-US): Secondary | ICD-10-CM | POA: Diagnosis not present

## 2022-09-21 DIAGNOSIS — Z01419 Encounter for gynecological examination (general) (routine) without abnormal findings: Secondary | ICD-10-CM | POA: Diagnosis not present

## 2022-09-21 DIAGNOSIS — Z131 Encounter for screening for diabetes mellitus: Secondary | ICD-10-CM | POA: Diagnosis not present

## 2022-09-21 DIAGNOSIS — E785 Hyperlipidemia, unspecified: Secondary | ICD-10-CM | POA: Diagnosis not present

## 2022-10-04 ENCOUNTER — Encounter (INDEPENDENT_AMBULATORY_CARE_PROVIDER_SITE_OTHER): Payer: Self-pay | Admitting: Internal Medicine

## 2022-10-04 ENCOUNTER — Ambulatory Visit (INDEPENDENT_AMBULATORY_CARE_PROVIDER_SITE_OTHER): Payer: 59 | Admitting: Internal Medicine

## 2022-10-04 VITALS — BP 136/90 | HR 71 | Temp 98.3°F | Ht 64.0 in | Wt 181.0 lb

## 2022-10-04 DIAGNOSIS — E669 Obesity, unspecified: Secondary | ICD-10-CM

## 2022-10-04 DIAGNOSIS — Z723 Lack of physical exercise: Secondary | ICD-10-CM | POA: Diagnosis not present

## 2022-10-04 DIAGNOSIS — E7849 Other hyperlipidemia: Secondary | ICD-10-CM | POA: Diagnosis not present

## 2022-10-04 DIAGNOSIS — Z6831 Body mass index (BMI) 31.0-31.9, adult: Secondary | ICD-10-CM

## 2022-10-04 DIAGNOSIS — I10 Essential (primary) hypertension: Secondary | ICD-10-CM

## 2022-10-07 DIAGNOSIS — Z23 Encounter for immunization: Secondary | ICD-10-CM | POA: Diagnosis not present

## 2022-10-15 ENCOUNTER — Other Ambulatory Visit (INDEPENDENT_AMBULATORY_CARE_PROVIDER_SITE_OTHER): Payer: Self-pay | Admitting: Family Medicine

## 2022-10-15 DIAGNOSIS — E559 Vitamin D deficiency, unspecified: Secondary | ICD-10-CM

## 2022-10-19 NOTE — Progress Notes (Unsigned)
Chief Complaint:   OBESITY Lindsey Burnett is here to discuss her progress with her obesity treatment plan along with follow-up of her obesity related diagnoses. Lindsey Burnett is on the Category 1 Plan and states she is following her eating plan approximately 85% of the time. Lindsey Burnett states she is not currently exercising.  Today's visit was #: 4 Starting weight: 199 lbs Starting date: 08/03/2022 Today's weight: 181 lbs Today's date: 10/04/2022 Total lbs lost to date: 18 Total lbs lost since last in-office visit: 6  Interim History: Lindsey Burnett has been doing well. Pt reports adequate satiety and satiation. She has an occasional [employer]asso bar and protein "cake." Bioimpedance shows a decrease in BFY, decrease in visceral fat, and stable muscle mass.  Subjective:   1. Essential hypertension BP not at gal of <120/80. Pt is on amlodipine-benazepril.   2. Other hyperlipidemia LDL 80. Pt is on adequate intensity statin therapy.  3. Physically inactive Counseled on benefits of physical activity.  Assessment/Plan:   1. Essential hypertension Continue weight loss therapy. Monitor BP in the morning and at night 3 times a week for non-dipping. Continue BP meds. Forward BP results to PCP, if BP is above target for treatment.  2. Other hyperlipidemia Weight loss therapy and continue statin therapy.  3. Physically inactive Strength training 2-3 days a week. Pt declined going to the gym and prefers home program. Pt advised on YouTube as a resource.  4. Obesity, current BMI 31.1 Lindsey Burnett is currently in the action stage of change. As such, her goal is to continue with weight loss efforts. She has agreed to the Category 1 Plan.   Exercise goals:  Begin strength training 2-3 days a week.  Behavioral modification strategies: increasing lean protein intake, increasing water intake, no skipping meals, and planning for success.  Lindsey Burnett has agreed to follow-up with our clinic in 2 weeks. She was informed of the importance  of frequent follow-up visits to maximize her success with intensive lifestyle modifications for her multiple health conditions.   Objective:   Blood pressure (!) 136/90, pulse 71, temperature 98.3 F (36.8 C), height '5\' 4"'$  (1.626 m), weight 181 lb (82.1 kg), SpO2 100 %. Body mass index is 31.07 kg/m.  General: Cooperative, alert, well developed, in no acute distress. HEENT: Conjunctivae and lids unremarkable. Cardiovascular: Regular rhythm.  Lungs: Normal work of breathing. Neurologic: No focal deficits.   Lab Results  Component Value Date   CREATININE 0.84 08/03/2022   BUN 13 08/03/2022   NA 146 (H) 08/03/2022   K 4.4 08/03/2022   CL 106 08/03/2022   CO2 26 08/03/2022   Lab Results  Component Value Date   ALT 30 08/03/2022   AST 23 08/03/2022   ALKPHOS 134 (H) 08/03/2022   BILITOT 0.4 08/03/2022   Lab Results  Component Value Date   HGBA1C 5.4 08/03/2022   HGBA1C 5.4 03/30/2022   HGBA1C 5.4 10/17/2019   Lab Results  Component Value Date   INSULIN 7.7 08/03/2022   Lab Results  Component Value Date   TSH 1.200 03/30/2022   Lab Results  Component Value Date   CHOL 149 03/30/2022   HDL 55 03/30/2022   LDLCALC 80 03/30/2022   TRIG 69 03/30/2022   CHOLHDL 2.7 03/30/2022   Lab Results  Component Value Date   VD25OH 17.1 (L) 08/03/2022   Lab Results  Component Value Date   WBC 10.8 08/03/2022   HGB 12.5 08/03/2022   HCT 37.7 08/03/2022   MCV 87 08/03/2022  PLT 296 08/03/2022    Attestation Statements:   Reviewed by clinician on day of visit: allergies, medications, problem list, medical history, surgical history, family history, social history, and previous encounter notes.  Time spent on visit including pre-visit chart review and post-visit care and charting was 20 minutes.   I, Kathlene November, BS, CMA, am acting as transcriptionist for Thomes Dinning, MD.  I have reviewed the above documentation for accuracy and completeness, and I agree with  the above. -  ***

## 2022-10-21 ENCOUNTER — Other Ambulatory Visit (INDEPENDENT_AMBULATORY_CARE_PROVIDER_SITE_OTHER): Payer: Self-pay

## 2022-10-21 DIAGNOSIS — E559 Vitamin D deficiency, unspecified: Secondary | ICD-10-CM

## 2022-10-21 MED ORDER — VITAMIN D (ERGOCALCIFEROL) 1.25 MG (50000 UNIT) PO CAPS: 50000.0000 [IU] | ORAL_CAPSULE | ORAL | 0 refills | Status: DC

## 2022-10-25 ENCOUNTER — Ambulatory Visit (INDEPENDENT_AMBULATORY_CARE_PROVIDER_SITE_OTHER): Payer: 59 | Admitting: Internal Medicine

## 2022-10-25 ENCOUNTER — Encounter (INDEPENDENT_AMBULATORY_CARE_PROVIDER_SITE_OTHER): Payer: Self-pay | Admitting: Internal Medicine

## 2022-10-25 VITALS — BP 133/80 | HR 84 | Temp 98.3°F | Ht 64.0 in | Wt 177.0 lb

## 2022-10-25 DIAGNOSIS — E559 Vitamin D deficiency, unspecified: Secondary | ICD-10-CM

## 2022-10-25 DIAGNOSIS — E669 Obesity, unspecified: Secondary | ICD-10-CM | POA: Diagnosis not present

## 2022-10-25 DIAGNOSIS — Z6830 Body mass index (BMI) 30.0-30.9, adult: Secondary | ICD-10-CM | POA: Diagnosis not present

## 2022-10-25 DIAGNOSIS — I10 Essential (primary) hypertension: Secondary | ICD-10-CM | POA: Diagnosis not present

## 2022-11-06 NOTE — Progress Notes (Signed)
Chief Complaint:   OBESITY Lindsey Burnett is here to discuss her progress with her obesity treatment plan along with follow-up of her obesity related diagnoses. Lindsey Burnett is on the Category 1 Plan and states she is following her eating plan approximately 75% of the time. Lindsey Burnett states she is not currently exercising.  Today's visit was #: 5 Starting weight: 199 lbs Starting date: 08/03/2022 Today's weight: 177 lbs Today's date: 10/25/2022 Total lbs lost to date: 22 Total lbs lost since last in-office visit: 4  Interim History: Lindsey Burnett continues to do well following meal plan with adequate satiety and satiation. She denies abnormal cravings or eating patterns. Pt is not exercising.  BIA shows a reduction in body fat percentage from 45.8 to 40%.  Muscle mass is preserved.  Visceral fat rating is down to 10 from 12.  Subjective:   1. Essential hypertension BP improved on Lotrel. Pt denies side effects or orthostatics.  2. Vitamin D deficiency She is currently taking prescription vitamin D 50,000 IU each week. She denies nausea, vomiting or muscle weakness.  Assessment/Plan:   1. Essential hypertension Continue weight loss therapy. Monitor for orthostasis while losing weight and continue medication.  2. Vitamin D deficiency She agrees to continue to take prescription Vitamin D '@50'$ ,000 IU every week and will follow-up for routine testing of Vitamin D, at least 2-3 times per year to avoid over-replacement.  3. Obesity, current BMI 30.5 Lindsey Burnett is currently in the action stage of change. As such, her goal is to continue with weight loss efforts. She has agreed to the Category 1 Plan.   Exercise goals:  Explore ways to increase physical activity in daily routine.  Behavioral modification strategies: increasing lean protein intake, increasing water intake, and holiday eating strategies .  Lindsey Burnett has agreed to follow-up with our clinic in 2 weeks. She was informed of the importance of frequent  follow-up visits to maximize her success with intensive lifestyle modifications for her multiple health conditions.   Objective:   Blood pressure 133/80, pulse 84, temperature 98.3 F (36.8 C), height '5\' 4"'$  (1.626 m), weight 177 lb (80.3 kg), SpO2 100 %. Body mass index is 30.38 kg/m.  General: Cooperative, alert, well developed, in no acute distress. HEENT: Conjunctivae and lids unremarkable. Cardiovascular: Regular rhythm.  Lungs: Normal work of breathing. Neurologic: No focal deficits.   Lab Results  Component Value Date   CREATININE 0.84 08/03/2022   BUN 13 08/03/2022   NA 146 (H) 08/03/2022   K 4.4 08/03/2022   CL 106 08/03/2022   CO2 26 08/03/2022   Lab Results  Component Value Date   ALT 30 08/03/2022   AST 23 08/03/2022   ALKPHOS 134 (H) 08/03/2022   BILITOT 0.4 08/03/2022   Lab Results  Component Value Date   HGBA1C 5.4 08/03/2022   HGBA1C 5.4 03/30/2022   HGBA1C 5.4 10/17/2019   Lab Results  Component Value Date   INSULIN 7.7 08/03/2022   Lab Results  Component Value Date   TSH 1.200 03/30/2022   Lab Results  Component Value Date   CHOL 149 03/30/2022   HDL 55 03/30/2022   LDLCALC 80 03/30/2022   TRIG 69 03/30/2022   CHOLHDL 2.7 03/30/2022   Lab Results  Component Value Date   VD25OH 17.1 (L) 08/03/2022   Lab Results  Component Value Date   WBC 10.8 08/03/2022   HGB 12.5 08/03/2022   HCT 37.7 08/03/2022   MCV 87 08/03/2022   PLT 296 08/03/2022  Attestation Statements:   Reviewed by clinician on day of visit: allergies, medications, problem list, medical history, surgical history, family history, social history, and previous encounter notes.  Time spent on visit including pre-visit chart review and post-visit care and charting was 20 minutes.   I, Lindsey Burnett November, BS, CMA, am acting as transcriptionist for Thomes Dinning, MD.  I have reviewed the above documentation for accuracy and completeness, and I agree with the above.  -Thomes Dinning, MD

## 2022-11-10 ENCOUNTER — Ambulatory Visit (INDEPENDENT_AMBULATORY_CARE_PROVIDER_SITE_OTHER): Payer: 59 | Admitting: Family Medicine

## 2022-11-16 ENCOUNTER — Other Ambulatory Visit (INDEPENDENT_AMBULATORY_CARE_PROVIDER_SITE_OTHER): Payer: Self-pay | Admitting: Internal Medicine

## 2022-11-16 DIAGNOSIS — E559 Vitamin D deficiency, unspecified: Secondary | ICD-10-CM

## 2022-11-20 ENCOUNTER — Other Ambulatory Visit (INDEPENDENT_AMBULATORY_CARE_PROVIDER_SITE_OTHER): Payer: Self-pay | Admitting: Internal Medicine

## 2022-11-20 DIAGNOSIS — E559 Vitamin D deficiency, unspecified: Secondary | ICD-10-CM

## 2022-11-21 ENCOUNTER — Other Ambulatory Visit: Payer: Self-pay | Admitting: Internal Medicine

## 2022-11-25 ENCOUNTER — Other Ambulatory Visit (INDEPENDENT_AMBULATORY_CARE_PROVIDER_SITE_OTHER): Payer: Self-pay | Admitting: Internal Medicine

## 2022-11-25 ENCOUNTER — Other Ambulatory Visit: Payer: Self-pay | Admitting: Internal Medicine

## 2022-11-25 DIAGNOSIS — E559 Vitamin D deficiency, unspecified: Secondary | ICD-10-CM

## 2022-11-30 ENCOUNTER — Encounter (INDEPENDENT_AMBULATORY_CARE_PROVIDER_SITE_OTHER): Payer: Self-pay | Admitting: Family Medicine

## 2022-11-30 ENCOUNTER — Ambulatory Visit (INDEPENDENT_AMBULATORY_CARE_PROVIDER_SITE_OTHER): Payer: 59 | Admitting: Family Medicine

## 2022-11-30 VITALS — BP 128/85 | HR 68 | Temp 98.7°F | Ht 64.0 in | Wt 175.0 lb

## 2022-11-30 DIAGNOSIS — E559 Vitamin D deficiency, unspecified: Secondary | ICD-10-CM

## 2022-11-30 DIAGNOSIS — Z6830 Body mass index (BMI) 30.0-30.9, adult: Secondary | ICD-10-CM

## 2022-11-30 DIAGNOSIS — E669 Obesity, unspecified: Secondary | ICD-10-CM

## 2022-11-30 DIAGNOSIS — K5909 Other constipation: Secondary | ICD-10-CM | POA: Diagnosis not present

## 2022-12-01 DIAGNOSIS — K5909 Other constipation: Secondary | ICD-10-CM | POA: Insufficient documentation

## 2022-12-20 NOTE — Progress Notes (Unsigned)
Chief Complaint:   OBESITY Lindsey Burnett is here to discuss her progress with her obesity treatment plan along with follow-up of her obesity related diagnoses. Lindsey Burnett is on the Category 1 Plan and states she is following her eating plan approximately 50% of the time. Lindsey Burnett states she has not been exercising.  Today's visit was #: 6 Starting weight: 199 lbs Starting date: 08/03/22 Today's weight: 175 lbs Today's date: 11/30/22 Total lbs lost to date: 24 Total lbs lost since last in-office visit: -2  Interim History: Back on meal plan now that holidays are over.  Did a 3-day fast through church in December.  Time has been a barrier to weight loss with work and caring for her dad. Net weight loss 24 pounds in 3.5 months equals 12% total body weight loss.  Subjective:   1. Chronic constipation Improving on MiraLAX daily. Drinks greater than 64 ounces water daily and high-fiber diet.  2. Vitamin D deficiency Taking prescription vitamin D 50,000 IU weekly. 08/03/2022 vitamin D level 17.1.  Assessment/Plan:   1. Chronic constipation Continue MiraLAX OTC daily.  2. Vitamin D deficiency Recheck vitamin D level next visit.  3. Obesity,current BMI 30.1 1.  Increase protein intake with breakfast.  Lashaun is currently in the action stage of change. As such, her goal is to continue with weight loss efforts. She has agreed to the Category 1 Plan.   Exercise goals: Track steps using smart watch.  Behavioral modification strategies: increasing lean protein intake, increasing vegetables, increasing water intake, decreasing eating out, no skipping meals, meal planning and cooking strategies, and decreasing junk food.  Lindsey Burnett has agreed to follow-up with our clinic in 4 weeks. She was informed of the importance of frequent follow-up visits to maximize her success with intensive lifestyle modifications for her multiple health conditions.    Objective:   Blood pressure 128/85, pulse 68,  temperature 98.7 F (37.1 C), height '5\' 4"'$  (1.626 m), weight 175 lb (79.4 kg), SpO2 97 %. Body mass index is 30.04 kg/m.  General: Cooperative, alert, well developed, in no acute distress. HEENT: Conjunctivae and lids unremarkable. Cardiovascular: Regular rhythm.  Lungs: Normal work of breathing. Neurologic: No focal deficits.   Lab Results  Component Value Date   CREATININE 0.84 08/03/2022   BUN 13 08/03/2022   NA 146 (H) 08/03/2022   K 4.4 08/03/2022   CL 106 08/03/2022   CO2 26 08/03/2022   Lab Results  Component Value Date   ALT 30 08/03/2022   AST 23 08/03/2022   ALKPHOS 134 (H) 08/03/2022   BILITOT 0.4 08/03/2022   Lab Results  Component Value Date   HGBA1C 5.4 08/03/2022   HGBA1C 5.4 03/30/2022   HGBA1C 5.4 10/17/2019   Lab Results  Component Value Date   INSULIN 7.7 08/03/2022   Lab Results  Component Value Date   TSH 1.200 03/30/2022   Lab Results  Component Value Date   CHOL 149 03/30/2022   HDL 55 03/30/2022   LDLCALC 80 03/30/2022   TRIG 69 03/30/2022   CHOLHDL 2.7 03/30/2022   Lab Results  Component Value Date   VD25OH 17.1 (L) 08/03/2022   Lab Results  Component Value Date   WBC 10.8 08/03/2022   HGB 12.5 08/03/2022   HCT 37.7 08/03/2022   MCV 87 08/03/2022   PLT 296 08/03/2022   No results found for: "IRON", "TIBC", "FERRITIN"   Attestation Statements:   Reviewed by clinician on day of visit: allergies, medications, problem list,  medical history, surgical history, family history, social history, and previous encounter notes.  I, Georgianne Fick, FNP, am acting as transcriptionist for Dr. Loyal Gambler.  I have reviewed the above documentation for accuracy and completeness, and I agree with the above. -  ***

## 2023-01-03 ENCOUNTER — Encounter (INDEPENDENT_AMBULATORY_CARE_PROVIDER_SITE_OTHER): Payer: Self-pay | Admitting: Family Medicine

## 2023-01-03 ENCOUNTER — Ambulatory Visit (INDEPENDENT_AMBULATORY_CARE_PROVIDER_SITE_OTHER): Payer: 59 | Admitting: Family Medicine

## 2023-01-03 VITALS — BP 124/86 | HR 70 | Temp 98.3°F | Ht 64.0 in | Wt 170.0 lb

## 2023-01-03 DIAGNOSIS — Z6829 Body mass index (BMI) 29.0-29.9, adult: Secondary | ICD-10-CM | POA: Diagnosis not present

## 2023-01-03 DIAGNOSIS — K5909 Other constipation: Secondary | ICD-10-CM | POA: Diagnosis not present

## 2023-01-03 DIAGNOSIS — E669 Obesity, unspecified: Secondary | ICD-10-CM

## 2023-01-03 DIAGNOSIS — E559 Vitamin D deficiency, unspecified: Secondary | ICD-10-CM | POA: Diagnosis not present

## 2023-01-03 NOTE — Assessment & Plan Note (Addendum)
Increased water intake to over 64 ounces per day Using Miralax 1 x a day as needed Has increased intake of fiber with fruits and vegetables daily

## 2023-01-03 NOTE — Assessment & Plan Note (Signed)
08/03/22 vitamin D level 17.1 Taking vitamin D 50,000 IU once weekly Energy level improving Denies GI side effects Due for recheck vitamin D level today

## 2023-01-03 NOTE — Assessment & Plan Note (Signed)
Improving Has lost 14.5% total body weight in the past 5 months of medically supervised weight management Preserving most of her lean muscle mass Has adopted an improved relationship with food Plans to be more consistent with regular exercise BMI is now under 30

## 2023-01-03 NOTE — Progress Notes (Signed)
Office: [phone]  /  Fax: [phone]  WEIGHT SUMMARY AND BIOMETRICS  Medical Weight Loss Height: 5' 4"$  (1.626 m) Weight: 170 lb (77.1 kg) Temp: 98.3 F (36.8 C) Pulse Rate: 70 BP: 124/86 SpO2: 100 % Fasting: no Labs: yes Today's Visit #: 7 Weight at Last VIsit: 175lb Weight Lost Since Last Visit: 5lb  Body Fat %: 39.4 % Fat Mass (lbs): 67 lbs Muscle Mass (lbs): 97.8 lbs Total Body Water (lbs): 68 lbs Visceral Fat Rating : 10 Starting Date: 08/03/22 Starting Weight: 199lb Total Weight Loss (lbs): 29 lb (13.2 kg)   HPI  Chief Complaint: OBESITY  Lindsey Burnett is here to discuss her progress with her obesity treatment plan. Lindsey Burnett is on the the Category 1 Plan and states Lindsey Burnett is following her eating plan approximately 75 % of the time. Lindsey Burnett states Lindsey Burnett is exercising 0 minutes 0 times per week.  Interval History:  Since last office visit Lindsey Burnett is down 5 lbs. Net weight loss in 5 mos. This is a 14.5% TBW loss. Lindsey Burnett plans to start walking outdoors with the weather change Prefers to walk outside [facility]. Denies hunger or cravings  Barriers to progress:  Caregiver for her dad, work schedule  Pharmacotherapy: none  PHYSICAL EXAM:  Blood pressure 124/86, pulse 70, temperature 98.3 F (36.8 C), height 5' 4"$  (1.626 m), weight 170 lb (77.1 kg), SpO2 100 %. Body mass index is 29.18 kg/m.  General: Lindsey Burnett is overweight, cooperative, alert, well developed, and in no acute distress. PSYCH: Has normal mood, affect and thought process.   HEENT: EOMI, sclerae are anicteric. Lungs: Normal breathing effort, no conversational dyspnea. Extremities: No edema.  Neurologic: No gross sensory or motor deficits. No tremors or fasciculations noted.    DIAGNOSTIC DATA REVIEWED:  BMET    Component Value Date/Time   NA 146 (H) 08/03/2022 0819   K 4.4 08/03/2022 0819   CL 106 08/03/2022 0819   CO2 26 08/03/2022 0819   GLUCOSE 86 08/03/2022 0819   BUN 13  08/03/2022 0819   CREATININE 0.84 08/03/2022 0819   CALCIUM 9.3 08/03/2022 0819   GFRNONAA 91 08/22/2020 1341   GFRAA 105 08/22/2020 1341   Lab Results  Component Value Date   HGBA1C 5.4 08/03/2022   HGBA1C 5.4 10/17/2019   Lab Results  Component Value Date   INSULIN 7.7 08/03/2022   Lab Results  Component Value Date   TSH 1.200 03/30/2022   CBC    Component Value Date/Time   WBC 10.8 08/03/2022 0819   RBC 4.34 08/03/2022 0819   HGB 12.5 08/03/2022 0819   HCT 37.7 08/03/2022 0819   PLT 296 08/03/2022 0819   MCV 87 08/03/2022 0819   MCH 28.8 08/03/2022 0819   MCHC 33.2 08/03/2022 0819   RDW 13.0 08/03/2022 0819   Iron Studies No results found for: "IRON", "TIBC", "FERRITIN", "IRONPCTSAT" Lipid Panel     Component Value Date/Time   CHOL 149 03/30/2022 1700   TRIG 69 03/30/2022 1700   HDL 55 03/30/2022 1700   CHOLHDL 2.7 03/30/2022 1700   LDLCALC 80 03/30/2022 1700   Hepatic Function Panel     Component Value Date/Time   PROT 6.9 08/03/2022 0819   ALBUMIN 4.0 08/03/2022 0819   AST 23 08/03/2022 0819   ALT 30 08/03/2022 0819   ALKPHOS 134 (H) 08/03/2022 0819   BILITOT 0.4 08/03/2022 0819      Component Value Date/Time   TSH 1.200  03/30/2022 1700   Nutritional Lab Results  Component Value Date   VD25OH 17.1 (L) 08/03/2022     ASSESSMENT AND PLAN  TREATMENT PLAN FOR OBESITY:  Recommended Dietary Goals  Lindsey Burnett is currently in the action stage of change. As such, her goal is to continue weight management plan. Lindsey Burnett has agreed to the Category 1 Plan.  Behavioral Intervention  We discussed the following Behavioral Modification Strategies today: increasing lean protein intake, increasing vegetables, increase water intake, work on meal planning and easy cooking plans, and think about ways to increase physical activity.  Additional resources provided today: NA  Recommended Physical Activity Goals  Lindsey Burnett has been advised to work up to 150 minutes of  moderate intensity aerobic activity a week and strengthening exercises 2-3 times per week for cardiovascular health, weight loss maintenance and preservation of muscle mass.   Lindsey Burnett has agreed to Will begin regular aerobic exercise 30 minutes, 4 times per week. Chosen activity walking.   Pharmacotherapy We discussed various medication options to help Lindsey Burnett with her weight loss efforts and we both agreed to none.  ASSOCIATED CONDITIONS ADDRESSED TODAY  Vitamin D deficiency Assessment & Plan: 08/03/22 vitamin D level 17.1 Taking vitamin D 50,000 IU once weekly Energy level improving Denies GI side effects Due for recheck vitamin D level today  Orders: -     VITAMIN D 25 Hydroxy (Vit-D Deficiency, Fractures)  Generalized obesity Assessment & Plan: Improving Has lost 14.5% total body weight in the past 5 months of medically supervised weight management Preserving most of her lean muscle mass Has adopted an improved relationship with food Plans to be more consistent with regular exercise BMI is now under 30   Other constipation Assessment & Plan: Increased water intake to over 64 ounces per day Using Miralax 1 x a day as needed Has increased intake of fiber with fruits and vegetables daily   BMI 29.0-29.9,adult      No follow-ups on file.Marland Kitchen Lindsey Burnett was informed of the importance of frequent follow up visits to maximize her success with intensive lifestyle modifications for her multiple health conditions.   ATTESTASTION STATEMENTS:  Reviewed by clinician on day of visit: allergies, medications, problem list, medical history, surgical history, family history, social history, and previous encounter notes.   Time spent on visit including pre-visit chart review and post-visit care and charting was 35 minutes.    Loyal Gambler DO

## 2023-01-04 LAB — VITAMIN D 25 HYDROXY (VIT D DEFICIENCY, FRACTURES): Vit D, 25-Hydroxy: 69.2 ng/mL (ref 30.0–100.0)

## 2023-01-24 ENCOUNTER — Ambulatory Visit (INDEPENDENT_AMBULATORY_CARE_PROVIDER_SITE_OTHER): Payer: 59 | Admitting: Family Medicine

## 2023-01-24 ENCOUNTER — Encounter (INDEPENDENT_AMBULATORY_CARE_PROVIDER_SITE_OTHER): Payer: Self-pay | Admitting: Family Medicine

## 2023-01-24 VITALS — BP 127/83 | HR 75 | Temp 98.5°F | Ht 64.0 in | Wt 171.0 lb

## 2023-01-24 DIAGNOSIS — E669 Obesity, unspecified: Secondary | ICD-10-CM | POA: Diagnosis not present

## 2023-01-24 DIAGNOSIS — E559 Vitamin D deficiency, unspecified: Secondary | ICD-10-CM | POA: Diagnosis not present

## 2023-01-24 DIAGNOSIS — Z6829 Body mass index (BMI) 29.0-29.9, adult: Secondary | ICD-10-CM | POA: Diagnosis not present

## 2023-01-24 DIAGNOSIS — Z636 Dependent relative needing care at home: Secondary | ICD-10-CM

## 2023-01-24 NOTE — Assessment & Plan Note (Signed)
Last vitamin D Lab Results  Component Value Date   VD25OH 69.2 01/03/2023   Reviewed lab with patient from last visit.  Vitamin D level improved from 17.1-69.2.  Energy level is improving.  Will discontinue prescription vitamin D 50,000 IU once daily and switch over to over-the-counter vitamin D 2000 IU once daily for maintenance.  Recheck level in the next 6 months.

## 2023-01-24 NOTE — Assessment & Plan Note (Signed)
Patient has a chronic caregiver stress taking care of her dad.  Her siblings have passed away.  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  She is aiming for 7 to 8 hours of sleep at night.

## 2023-01-24 NOTE — Assessment & Plan Note (Signed)
Patient has done well overall losing 28 pounds which is a 14% total body weight loss over the past 5 months of medically supervised weight management.  Her BMI is under 30.  She has started regaining muscle mass.  She would like to lose approximately 10 more pounds.  We discussed the importance of adding in regular exercise with a goal of 30 minutes or more of walking at least 4 days a week.  She may also start adding in more high-fiber food sources including a high-fiber starch with dinner and 2 fruit servings total each day.  We discussed some low calorie popcorn options for snacks at night.  Avoid high calorie density snacks such as nuts.

## 2023-01-24 NOTE — Progress Notes (Signed)
Office: [phone]  /  Fax: [phone]  WEIGHT SUMMARY AND BIOMETRICS  Vitals Temp: 98.5 F (36.9 C) BP: 127/83 Pulse Rate: 75 SpO2: 98 %   Anthropometric Measurements Height: '5\' 4"'$  (1.626 m) Weight: 171 lb (77.6 kg) BMI (Calculated): 29.34 Weight at Last Visit: 170lb Weight Lost Since Last Visit: +1 Starting Weight: 199lb Total Weight Loss (lbs): 28 lb (12.7 kg)   Body Composition  Body Fat %: 39.4 % Fat Mass (lbs): 67.4 lbs Muscle Mass (lbs): 98.6 lbs Total Body Water (lbs): 71.4 lbs Visceral Fat Rating : 10   Other Clinical Data Fasting: yes Labs: no Today's Visit #: 8 Starting Date: 08/03/22     HPI  Chief Complaint: OBESITY  Lindsey Burnett is here to discuss her progress with her obesity treatment plan. She is on the the Category 1 Plan and states she is following her eating plan approximately 50 % of the time. She states she is not exercising.    Interval History:  Since last office visit she is is up 1 lb-0.8 pounds was muscle gain This gives her a net weight loss of 28 lb which is a 14% TBW loss She is hungrier at night lately, eating popcorn, Fiber one brownies, salted walnuts She is eating all the foods on her plan including a snack in the morning and the afternoon She is eating some (not all veggies) with her dinner.   Plans to add in more walking this Spring  Pharmacotherapy: none  PHYSICAL EXAM:  Blood pressure 127/83, pulse 75, temperature 98.5 F (36.9 C), height '5\' 4"'$  (1.626 m), weight 171 lb (77.6 kg), SpO2 98 %. Body mass index is 29.35 kg/m.  General: She is overweight, cooperative, alert, well developed, and in no acute distress. PSYCH: Has normal mood, affect and thought process.   Lungs: Normal breathing effort, no conversational dyspnea.  DIAGNOSTIC DATA REVIEWED:  BMET    Component Value Date/Time   NA 146 (H) 08/03/2022 0819   K 4.4 08/03/2022 0819   CL 106 08/03/2022 0819   CO2 26 08/03/2022 0819   GLUCOSE 86  08/03/2022 0819   BUN 13 08/03/2022 0819   CREATININE 0.84 08/03/2022 0819   CALCIUM 9.3 08/03/2022 0819   GFRNONAA 91 08/22/2020 1341   GFRAA 105 08/22/2020 1341   Lab Results  Component Value Date   HGBA1C 5.4 08/03/2022   HGBA1C 5.4 10/17/2019   Lab Results  Component Value Date   INSULIN 7.7 08/03/2022   Lab Results  Component Value Date   TSH 1.200 03/30/2022   CBC    Component Value Date/Time   WBC 10.8 08/03/2022 0819   RBC 4.34 08/03/2022 0819   HGB 12.5 08/03/2022 0819   HCT 37.7 08/03/2022 0819   PLT 296 08/03/2022 0819   MCV 87 08/03/2022 0819   MCH 28.8 08/03/2022 0819   MCHC 33.2 08/03/2022 0819   RDW 13.0 08/03/2022 0819   Iron Studies No results found for: "IRON", "TIBC", "FERRITIN", "IRONPCTSAT" Lipid Panel     Component Value Date/Time   CHOL 149 03/30/2022 1700   TRIG 69 03/30/2022 1700   HDL 55 03/30/2022 1700   CHOLHDL 2.7 03/30/2022 1700   LDLCALC 80 03/30/2022 1700   Hepatic Function Panel     Component Value Date/Time   PROT 6.9 08/03/2022 0819   ALBUMIN 4.0 08/03/2022 0819   AST 23 08/03/2022 0819   ALT 30 08/03/2022 0819   ALKPHOS 134 (H) 08/03/2022 0819   BILITOT 0.4 08/03/2022 [id number]  Component Value Date/Time   TSH 1.200 03/30/2022 1700   Nutritional Lab Results  Component Value Date   VD25OH 69.2 01/03/2023   VD25OH 17.1 (L) 08/03/2022     ASSESSMENT AND PLAN  TREATMENT PLAN FOR OBESITY:  Recommended Dietary Goals  Lindsey Burnett is currently in the action stage of change. As such, her goal is to continue weight management plan. She has agreed to the Category 1 Plan.  Behavioral Intervention  We discussed the following Behavioral Modification Strategies today: increasing lean protein intake, increasing vegetables, increasing water intake, and work on meal planning and easy cooking plans.  Additional resources provided today: NA  Recommended Physical Activity Goals  Lindsey Burnett has been advised to work up to 150 minutes  of moderate intensity aerobic activity a week and strengthening exercises 2-3 times per week for cardiovascular health, weight loss maintenance and preservation of muscle mass.   She has agreed to Will begin regular aerobic exercise 30 minutes, 4 times per week. Chosen activity walking.     Pharmacotherapy We discussed various medication options to help Lindsey Burnett with her weight loss efforts and we both agreed to none.  ASSOCIATED CONDITIONS ADDRESSED TODAY  Vitamin D deficiency Assessment & Plan: Last vitamin D Lab Results  Component Value Date   VD25OH 69.2 01/03/2023   Reviewed lab with patient from last visit.  Vitamin D level improved from 17.1-69.2.  Energy level is improving.  Will discontinue prescription vitamin D 50,000 IU once daily and switch over to over-the-counter vitamin D 2000 IU once daily for maintenance.  Recheck level in the next 6 months.   Generalized obesity Assessment & Plan: Patient has done well overall losing 28 pounds which is a 14% total body weight loss over the past 5 months of medically supervised weight management.  Her BMI is under 30.  She has started regaining muscle mass.  She would like to lose approximately 10 more pounds.  We discussed the importance of adding in regular exercise with a goal of 30 minutes or more of walking at least 4 days a week.  She may also start adding in more high-fiber food sources including a high-fiber starch with dinner and 2 fruit servings total each day.  We discussed some low calorie popcorn options for snacks at night.  Avoid high calorie density snacks such as nuts.   BMI 29.0-29.9,adult  Caregiver stress Assessment & Plan: Patient has a chronic caregiver stress taking care of her dad.  Her siblings have passed away.  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  She is aiming for 7 to 8 hours of sleep at night.       Return in about 4 weeks (around 02/21/2023).Marland Kitchen She was informed  of the importance of frequent follow up visits to maximize her success with intensive lifestyle modifications for her multiple health conditions.   ATTESTASTION STATEMENTS:  Reviewed by clinician on day of visit: allergies, medications, problem list, medical history, surgical history, family history, social history, and previous encounter notes.   I have personally spent 30 minutes total time today in preparation, patient care, nutritional counseling and documentation for this visit, including the following: review of clinical lab tests; review of medical tests/procedures/services.      Dell Ponto, DO

## 2023-02-13 ENCOUNTER — Other Ambulatory Visit: Payer: Self-pay | Admitting: Internal Medicine

## 2023-02-13 DIAGNOSIS — I10 Essential (primary) hypertension: Secondary | ICD-10-CM

## 2023-02-24 ENCOUNTER — Encounter (INDEPENDENT_AMBULATORY_CARE_PROVIDER_SITE_OTHER): Payer: Self-pay | Admitting: Family Medicine

## 2023-02-24 ENCOUNTER — Ambulatory Visit (INDEPENDENT_AMBULATORY_CARE_PROVIDER_SITE_OTHER): Payer: 59 | Admitting: Family Medicine

## 2023-02-24 VITALS — BP 133/83 | HR 63 | Temp 97.8°F | Ht 64.0 in | Wt 168.0 lb

## 2023-02-24 DIAGNOSIS — E559 Vitamin D deficiency, unspecified: Secondary | ICD-10-CM

## 2023-02-24 DIAGNOSIS — E669 Obesity, unspecified: Secondary | ICD-10-CM | POA: Diagnosis not present

## 2023-02-24 DIAGNOSIS — Z6828 Body mass index (BMI) 28.0-28.9, adult: Secondary | ICD-10-CM

## 2023-02-24 DIAGNOSIS — I10 Essential (primary) hypertension: Secondary | ICD-10-CM

## 2023-02-24 NOTE — Progress Notes (Signed)
Office: [phone]  /  Fax: [place]  Starting Date: 08/03/22  Starting Weight: 199lb   Weight Lost Since Last Visit: 3lb   Vitals Temp: 97.8 F (36.6 C) BP: 133/83 Pulse Rate: 63 SpO2: 100 %   Body Composition  Body Fat %: 38.8 % Fat Mass (lbs): 65.2 lbs Muscle Mass (lbs): 97.6 lbs Total Body Water (lbs): 69 lbs Visceral Fat Rating : 9    HPI  Chief Complaint: OBESITY  Lindsey Burnett is here to discuss her progress with her obesity treatment plan. She is on the the Category 1 Plan and states she is following her eating plan approximately 80 % of the time. She states she is exercising 30 minutes 3 times per week.   Interval History:  Since last office visit she is down 3 lb [facility] 2-[facility] x a [facility], 30 min at a time She walks on her way home and she is planning out her dinners these days Denies hunger or cravings She is not tired of the Cat 1 meal plan She is liking the low calorie popcorn and [place] bar at night She brings lunch to work She has increased her water intake She would like to lose 8 more lbs  Pharmacotherapy: none   PHYSICAL EXAM:  Blood pressure 133/83, pulse 63, temperature 97.8 F (36.6 C), height 5\' 4"  (1.626 m), weight 168 lb (76.2 kg), SpO2 100 %. Body mass index is 28.84 kg/m.  General: She is normal weight, wearing a mask cooperative, alert, well developed, and in no acute distress. PSYCH: Has normal mood, affect and thought process.   Lungs: Normal breathing effort, no conversational dyspnea.   ASSESSMENT AND PLAN  TREATMENT PLAN FOR OBESITY:  Recommended Dietary Goals  Lindsey Burnett is currently in the action stage of change. As such, her goal is to continue weight management plan. She has agreed to the Category 1 Plan.  Behavioral Intervention  We discussed the following Behavioral Modification Strategies today: increasing lean protein intake, decreasing simple  carbohydrates , increasing vegetables, increasing lower glycemic fruits, increasing fiber rich foods, avoiding skipping meals, increasing water intake, work on meal planning and preparation, and work on tracking and journaling calories using tracking application.  Additional resources provided today: NA  Recommended Physical Activity Goals  Lindsey Burnett has been advised to work up to 150 minutes of moderate intensity aerobic activity a week and strengthening exercises 2-3 times per week for cardiovascular health, weight loss maintenance and preservation of muscle mass.   She has agreed to Increase the intensity, frequency or duration of aerobic exercises    Pharmacotherapy changes for the treatment of obesity: none  ASSOCIATED CONDITIONS ADDRESSED TODAY  Vitamin D deficiency Assessment & Plan: Last vitamin D Lab Results  Component Value Date   VD25OH 69.2 01/03/2023   Patient has maintained a good vitamin D level last checked in February.  She has been consistent with taking over-the-counter vitamin D 2000 IU once daily.  Her energy level has been good.  Plan continue over-the-counter vitamin D 2000 IU once daily.  Recheck in 4 to 6 months.   Generalized obesity Assessment & Plan: Reviewed patient's overall progress.  She has lost a net 31 pounds in 6.5 months of medically supervised weight management.  Her BMI is 28 and she has maintained most of her muscle mass.  Her initial BMR was fairly low just over 1200 cal/day.  Plan-we discussed a target weight of 160  pounds, maintaining protein intake around 80 g/day and aiming for 30 minutes of walking 5 days a week for maintenance of weight loss and cardiovascular benefit.  Will repeat her indirect calorimetry test in June as we prepare for maintenance phase   BMI 28.0-28.9,adult  Essential hypertension Assessment & Plan: Blood pressure is well-controlled on amlodipine/benazepril 5/10 mg once daily per PCP.  She denies adverse side  effects.  Denies chest pain or headaches.  Plan continue current antihypertensive medications.  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   ATTESTASTION STATEMENTS:  Reviewed by clinician on day of visit: allergies, medications, problem list, medical history, surgical history, family history, social history, and previous encounter notes pertinent to obesity diagnosis.   I have personally spent 30 minutes total time today in preparation, patient care, nutritional counseling and documentation for this visit, including the following: review of clinical lab tests; review of medical tests/procedures/services.      Dell Ponto, DO DABFM, DABOM [facility] [address] [place], [place] [address] [phone]

## 2023-02-24 NOTE — Assessment & Plan Note (Signed)
Blood pressure is well-controlled on amlodipine/benazepril 5/10 mg once daily per PCP.  She denies adverse side effects.  Denies chest pain or headaches.  Plan continue current antihypertensive medications.  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

## 2023-02-24 NOTE — Assessment & Plan Note (Signed)
Last vitamin D Lab Results  Component Value Date   VD25OH 69.2 01/03/2023   Patient has maintained a good vitamin D level last checked in February.  She has been consistent with taking over-the-counter vitamin D 2000 IU once daily.  Her energy level has been good.  Plan continue over-the-counter vitamin D 2000 IU once daily.  Recheck in 4 to 6 months.

## 2023-02-24 NOTE — Assessment & Plan Note (Signed)
Reviewed patient's overall progress.  She has lost a net 31 pounds in 6.5 months of medically supervised weight management.  Her BMI is 28 and she has maintained most of her muscle mass.  Her initial BMR was fairly low just over 1200 cal/day.  Plan-we discussed a target weight of 160 pounds, maintaining protein intake around 80 g/day and aiming for 30 minutes of walking 5 days a week for maintenance of weight loss and cardiovascular benefit.  Will repeat her indirect calorimetry test in June as we prepare for maintenance phase

## 2023-03-08 DIAGNOSIS — E785 Hyperlipidemia, unspecified: Secondary | ICD-10-CM | POA: Diagnosis not present

## 2023-03-16 DIAGNOSIS — I10 Essential (primary) hypertension: Secondary | ICD-10-CM | POA: Diagnosis not present

## 2023-03-16 DIAGNOSIS — M25512 Pain in left shoulder: Secondary | ICD-10-CM | POA: Diagnosis not present

## 2023-03-16 DIAGNOSIS — M25511 Pain in right shoulder: Secondary | ICD-10-CM | POA: Diagnosis not present

## 2023-03-16 DIAGNOSIS — Z6827 Body mass index (BMI) 27.0-27.9, adult: Secondary | ICD-10-CM | POA: Diagnosis not present

## 2023-03-16 DIAGNOSIS — E785 Hyperlipidemia, unspecified: Secondary | ICD-10-CM | POA: Diagnosis not present

## 2023-03-28 ENCOUNTER — Ambulatory Visit (INDEPENDENT_AMBULATORY_CARE_PROVIDER_SITE_OTHER): Payer: 59 | Admitting: Family Medicine

## 2023-03-28 ENCOUNTER — Encounter (INDEPENDENT_AMBULATORY_CARE_PROVIDER_SITE_OTHER): Payer: Self-pay | Admitting: Family Medicine

## 2023-03-28 VITALS — BP 133/82 | HR 77 | Temp 98.0°F | Ht 64.0 in | Wt 165.0 lb

## 2023-03-28 DIAGNOSIS — E669 Obesity, unspecified: Secondary | ICD-10-CM | POA: Diagnosis not present

## 2023-03-28 DIAGNOSIS — Z6828 Body mass index (BMI) 28.0-28.9, adult: Secondary | ICD-10-CM | POA: Diagnosis not present

## 2023-03-28 DIAGNOSIS — E559 Vitamin D deficiency, unspecified: Secondary | ICD-10-CM | POA: Diagnosis not present

## 2023-03-28 DIAGNOSIS — F4321 Adjustment disorder with depressed mood: Secondary | ICD-10-CM

## 2023-03-28 NOTE — Assessment & Plan Note (Signed)
Overall, patient has done well with medically supervised weight management down 17% total body weight loss in the past 8 months.  She has not needed antiobesity medication.  She has done well on category 1 meal plan.  Her initial basal metabolic rate was relatively low.  Will repeat her indirect calorimetry next visit in preparation for maintenance phase.  Her visceral fat rating is staying under 10 and her BMI is under 30.  We discussed the importance of regular exercise over 150 minutes of moderate intensity exercise including 2 to 3 days of resistance training exercise for maintenance of weight loss.  Will focus on proper sleep at night, protein intake with meals and regular physical activity to keep her metabolic rate higher.

## 2023-03-28 NOTE — Progress Notes (Signed)
Office: [phone]  /  Fax: [phone]  WEIGHT SUMMARY AND BIOMETRICS  Starting Date: 08/03/22  Starting Weight: 199lb   Weight Lost Since Last Visit: 3lb   Vitals Temp: 98 F (36.7 C) BP: 133/82 Pulse Rate: 77 SpO2: 100 %   Body Composition  Body Fat %: 37.6 % Fat Mass (lbs): 62.4 lbs Muscle Mass (lbs): 98.2 lbs Total Body Water (lbs): 68.6 lbs Visceral Fat Rating : 9     HPI  Chief Complaint: OBESITY  Lindsey Burnett is here to discuss her progress with her obesity treatment plan. She is on the the Category 1 Plan and states she is following her eating plan approximately 80 % of the time. She states she is exercising 0 minutes 0 times per week.   Interval History:  Since last office visit she is down 3 lb This gives her a net weight loss of 34 lb in the past 8 mos This is a 17% total body weight loss She has gained 0.6 pounds of muscle mass and lost 2.8 pounds of body fat in the past month She has purchased free weights for home She has been more physically active She denies hunger or cravings on category 1 meal plan She would like to lose an additional 10 pounds  Pharmacotherapy: None  PHYSICAL EXAM:  Blood pressure 133/82, pulse 77, temperature 98 F (36.7 C), height 5\' 4"  (1.626 m), weight 165 lb (74.8 kg), SpO2 100 %. Body mass index is 28.32 kg/m.  General: She is overweight, cooperative, alert, well developed, and in no acute distress. PSYCH: Has normal mood, affect and thought process.   Lungs: Normal breathing effort, no conversational dyspnea.   ASSESSMENT AND PLAN  TREATMENT PLAN FOR OBESITY:  Recommended Dietary Goals  Lindsey Burnett is currently in the action stage of change. As such, her goal is to continue weight management plan. She has agreed to the Category 1 Plan.  Behavioral Intervention  We discussed the following Behavioral Modification Strategies today: increasing lean protein intake, decreasing simple carbohydrates , increasing  vegetables, increasing lower glycemic fruits, increasing fiber rich foods, increasing water intake, work on meal planning and preparation, continue to work on implementation of reduced calorie nutritional plan, continue to practice mindfulness when eating, and planning for success.  Additional resources provided today: NA  Recommended Physical Activity Goals  Lindsey Burnett has been advised to work up to 150 minutes of moderate intensity aerobic activity a week and strengthening exercises 2-3 times per week for cardiovascular health, weight loss maintenance and preservation of muscle mass.   She has agreed to [employer] strengthening exercises with a goal of 2-3 sessions a week  and Start aerobic activity with a goal of 150 minutes a week at moderate intensity.   Pharmacotherapy changes for the treatment of obesity: None  ASSOCIATED CONDITIONS ADDRESSED TODAY  Vitamin D deficiency Assessment & Plan: Last vitamin D Lab Results  Component Value Date   VD25OH 69.2 01/03/2023   She has been finishing out prescription vitamin D 50,000 IU q. 14 days.  Her energy level has been well.  She denies adverse side effects. We discussed the target vitamin D level 50-70  Plan to recheck vitamin D level with fasting labs next visit   Generalized obesity Assessment & Plan: Overall, patient has done well with medically supervised weight management down 17% total body weight loss in the past 8 months.  She has not needed antiobesity medication.  She has done well on category 1 meal plan.  Her initial  basal metabolic rate was relatively low.  Will repeat her indirect calorimetry next visit in preparation for maintenance phase.  Her visceral fat rating is staying under 10 and her BMI is under 30.  We discussed the importance of regular exercise over 150 minutes of moderate intensity exercise including 2 to 3 days of resistance training exercise for maintenance of weight loss.  Will focus on proper sleep at night,  protein intake with meals and regular physical activity to keep her metabolic rate higher.   BMI 28.0-28.9,adult  Grief reaction Assessment & Plan: She is still grieving the loss of her sister.  She has her family for support.  She has not scheduled her annual beach trip since the passing of her sister.  She denies emotional eating.  Consider adding in counseling if needed.  We discussed scheduling time off in the summer for relaxation.       She was informed of the importance of frequent follow up visits to maximize her success with intensive lifestyle modifications for her multiple health conditions.   ATTESTASTION STATEMENTS:  Reviewed by clinician on day of visit: allergies, medications, problem list, medical history, surgical history, family history, social history, and previous encounter notes pertinent to obesity diagnosis.   I have personally spent 30 minutes total time today in preparation, patient care, nutritional counseling and documentation for this visit, including the following: review of clinical lab tests; review of medical tests/procedures/services.      Glennis Brink, DO DABFM, DABOM [facility] [address] [place], [place] [address] [phone]

## 2023-03-28 NOTE — Assessment & Plan Note (Signed)
She is still grieving the loss of her sister.  She has her family for support.  She has not scheduled her annual beach trip since the passing of her sister.  She denies emotional eating.  Consider adding in counseling if needed.  We discussed scheduling time off in the summer for relaxation.

## 2023-03-28 NOTE — Assessment & Plan Note (Signed)
Last vitamin D Lab Results  Component Value Date   VD25OH 69.2 01/03/2023   She has been finishing out prescription vitamin D 50,000 IU q. 14 days.  Her energy level has been well.  She denies adverse side effects. We discussed the target vitamin D level 50-70  Plan to recheck vitamin D level with fasting labs next visit

## 2023-03-30 DIAGNOSIS — Z6827 Body mass index (BMI) 27.0-27.9, adult: Secondary | ICD-10-CM | POA: Diagnosis not present

## 2023-03-30 DIAGNOSIS — M25511 Pain in right shoulder: Secondary | ICD-10-CM | POA: Diagnosis not present

## 2023-03-30 DIAGNOSIS — M25512 Pain in left shoulder: Secondary | ICD-10-CM | POA: Diagnosis not present

## 2023-04-12 DIAGNOSIS — M25511 Pain in right shoulder: Secondary | ICD-10-CM | POA: Diagnosis not present

## 2023-04-12 DIAGNOSIS — M7542 Impingement syndrome of left shoulder: Secondary | ICD-10-CM | POA: Diagnosis not present

## 2023-04-12 DIAGNOSIS — R531 Weakness: Secondary | ICD-10-CM | POA: Diagnosis not present

## 2023-04-12 DIAGNOSIS — M25512 Pain in left shoulder: Secondary | ICD-10-CM | POA: Diagnosis not present

## 2023-04-28 DIAGNOSIS — M7542 Impingement syndrome of left shoulder: Secondary | ICD-10-CM | POA: Diagnosis not present

## 2023-04-28 DIAGNOSIS — M25512 Pain in left shoulder: Secondary | ICD-10-CM | POA: Diagnosis not present

## 2023-04-28 DIAGNOSIS — M25511 Pain in right shoulder: Secondary | ICD-10-CM | POA: Diagnosis not present

## 2023-04-28 DIAGNOSIS — R531 Weakness: Secondary | ICD-10-CM | POA: Diagnosis not present

## 2023-05-02 ENCOUNTER — Ambulatory Visit (INDEPENDENT_AMBULATORY_CARE_PROVIDER_SITE_OTHER): Payer: 59 | Admitting: Family Medicine

## 2023-05-02 ENCOUNTER — Encounter (INDEPENDENT_AMBULATORY_CARE_PROVIDER_SITE_OTHER): Payer: Self-pay | Admitting: Family Medicine

## 2023-05-02 VITALS — BP 126/79 | HR 64 | Temp 97.8°F | Ht 64.0 in | Wt 165.0 lb

## 2023-05-02 DIAGNOSIS — E669 Obesity, unspecified: Secondary | ICD-10-CM | POA: Diagnosis not present

## 2023-05-02 DIAGNOSIS — Z6828 Body mass index (BMI) 28.0-28.9, adult: Secondary | ICD-10-CM

## 2023-05-02 DIAGNOSIS — R948 Abnormal results of function studies of other organs and systems: Secondary | ICD-10-CM

## 2023-05-02 DIAGNOSIS — E559 Vitamin D deficiency, unspecified: Secondary | ICD-10-CM

## 2023-05-02 NOTE — Assessment & Plan Note (Signed)
Reviewed bioimpedance results.  Patient has maintained her weight over the past 4 weeks.  She has a total body weight loss of 34 pounds in the past 9 months of medically supervised weight management.  That is a 17% total body weight loss.  Her RMR has improved with eating on a schedule, increased protein intake.  She has room for improvement with regular exercise and getting 7 to 8 hours of sleep at night.  Her metabolic rate has improved by 578 cal/day in the past 9 months.  Will adjust her meal plan to the category 2 meal plan, copy provided today. Continue tracking daily steps using a smart watch with a goal of 8000 steps per day. We set a goal for sleep 7 to 8 hours at night.

## 2023-05-02 NOTE — Assessment & Plan Note (Signed)
Improving as shown on IC test today.  RMR improved from 1269 to 1397 cal/day.  Adjusted category 2 meal plan (1200 cal/day).  Daily protein target 85 g/day. Will increase exercise time by tracking of daily steps and eventually adding in some resistance training. Aim for 7 to 8 hours of sleep at night. Muscle mass has been relatively stable.

## 2023-05-02 NOTE — Progress Notes (Signed)
Office: [phone]  /  Fax: [phone]  WEIGHT SUMMARY AND BIOMETRICS  Starting Date: 08/03/22  Starting Weight: 199lb   Weight Lost Since Last Visit: 0lb   Vitals Temp: 97.8 F (36.6 C) BP: 126/79 Pulse Rate: 64 SpO2: 100 %   Body Composition  Body Fat %: 37.8 % Fat Mass (lbs): 62.4 lbs Muscle Mass (lbs): 97.6 lbs Total Body Water (lbs): 96.6 lbs Visceral Fat Rating : 9     HPI  Chief Complaint: OBESITY  Lindsey Burnett is here to discuss her progress with her obesity treatment plan. She is on the the Category 1 Plan and states she is following her eating plan approximately 50 % of the time. She states she is exercising 0 minutes 0 times per week.   Interval History:  Since last office visit she is down 0 lb She gets off plan when her great niece comes in town but she has limited portion sizes  She is down 0.6 lb muscle mass and is down 0 lb of muscle mass Her net weight loss if 34 lb RMR from 9/12: 1267 cal/ day --> 1397 cal/ day Hunger and cravings under good control Has not started to exercise   Change to category 2 meal plan    Pharmacotherapy: None  PHYSICAL EXAM:  Blood pressure 126/79, pulse 64, temperature 97.8 F (36.6 C), height 5\' 4"  (1.626 m), weight 165 lb (74.8 kg), SpO2 100 %. Body mass index is 28.32 kg/m.  General: She is overweight, cooperative, alert, well developed, and in no acute distress. PSYCH: Has normal mood, affect and thought process.   Lungs: Normal breathing effort, no conversational dyspnea.   ASSESSMENT AND PLAN  TREATMENT PLAN FOR OBESITY:  Recommended Dietary Goals  Lindsey Burnett is currently in the action stage of change. As such, her goal is to continue weight management plan. She has agreed to the Category 2 Plan.  Behavioral Intervention  We discussed the following Behavioral Modification Strategies today: increasing lean protein intake, decreasing simple carbohydrates , increasing vegetables, increasing lower  glycemic fruits, avoiding skipping meals, increasing water intake, keeping healthy foods at home, work on managing stress, creating time for self-care and relaxation measures, continue to practice mindfulness when eating, and planning for success.  Additional resources provided today: NA  Recommended Physical Activity Goals  Lindsey Burnett has been advised to work up to 150 minutes of moderate intensity aerobic activity a week and strengthening exercises 2-3 times per week for cardiovascular health, weight loss maintenance and preservation of muscle mass.   She has agreed to Work on scheduling and tracking physical activity.   Pharmacotherapy changes for the treatment of obesity: None  ASSOCIATED CONDITIONS ADDRESSED TODAY  Low basal metabolic rate Assessment & Plan: Improving as shown on IC test today.  RMR improved from 1269 to 1397 cal/day.  Adjusted category 2 meal plan (1200 cal/day).  Daily protein target 85 g/day. Will increase exercise time by tracking of daily steps and eventually adding in some resistance training. Aim for 7 to 8 hours of sleep at night. Muscle mass has been relatively stable.   Generalized obesity with starting BMI 34 Assessment & Plan: Reviewed bioimpedance results.  Patient has maintained her weight over the past 4 weeks.  She has a total body weight loss of 34 pounds in the past 9 months of medically supervised weight management.  That is a 17% total body weight loss.  Her RMR has improved with eating on a schedule, increased protein intake.  She has room  for improvement with regular exercise and getting 7 to 8 hours of sleep at night.  Her metabolic rate has improved by 161 cal/day in the past 9 months.  Will adjust her meal plan to the category 2 meal plan, copy provided today. Continue tracking daily steps using a smart watch with a goal of 8000 steps per day. We set a goal for sleep 7 to 8 hours at night.   BMI 28.0-28.9,adult  Vitamin D  deficiency Assessment & Plan: She is currently on prescription vitamin D 50,000 IU q. 14 days.  When she completes her current prescription, she may change over to over-the-counter vitamin D 2000 IU once daily.  Recheck level in September       She was informed of the importance of frequent follow up visits to maximize her success with intensive lifestyle modifications for her multiple health conditions.   ATTESTASTION STATEMENTS:  Reviewed by clinician on day of visit: allergies, medications, problem list, medical history, surgical history, family history, social history, and previous encounter notes pertinent to obesity diagnosis.   I have personally spent 30 minutes total time today in preparation, patient care, nutritional counseling and documentation for this visit, including the following: review of clinical lab tests; review of medical tests/procedures/services.      Glennis Brink, DO DABFM, DABOM [facility] [address] [place], [place] [address] [phone]

## 2023-05-02 NOTE — Assessment & Plan Note (Signed)
She is currently on prescription vitamin D 50,000 IU q. 14 days.  When she completes her current prescription, she may change over to over-the-counter vitamin D 2000 IU once daily.  Recheck level in September

## 2023-05-13 DIAGNOSIS — H01004 Unspecified blepharitis left upper eyelid: Secondary | ICD-10-CM | POA: Diagnosis not present

## 2023-05-13 DIAGNOSIS — Z6827 Body mass index (BMI) 27.0-27.9, adult: Secondary | ICD-10-CM | POA: Diagnosis not present

## 2023-05-19 ENCOUNTER — Encounter (INDEPENDENT_AMBULATORY_CARE_PROVIDER_SITE_OTHER): Payer: Self-pay | Admitting: Internal Medicine

## 2023-05-19 ENCOUNTER — Ambulatory Visit (INDEPENDENT_AMBULATORY_CARE_PROVIDER_SITE_OTHER): Payer: 59 | Admitting: Internal Medicine

## 2023-05-19 VITALS — BP 123/78 | HR 60 | Temp 98.1°F | Ht 64.0 in | Wt 164.0 lb

## 2023-05-19 DIAGNOSIS — R948 Abnormal results of function studies of other organs and systems: Secondary | ICD-10-CM

## 2023-05-19 DIAGNOSIS — Z6828 Body mass index (BMI) 28.0-28.9, adult: Secondary | ICD-10-CM

## 2023-05-19 DIAGNOSIS — E669 Obesity, unspecified: Secondary | ICD-10-CM

## 2023-05-19 DIAGNOSIS — I10 Essential (primary) hypertension: Secondary | ICD-10-CM | POA: Diagnosis not present

## 2023-05-19 NOTE — Progress Notes (Signed)
Office: [phone]  /  Fax: [phone]  WEIGHT SUMMARY AND BIOMETRICS  Vitals Temp: 98.1 F (36.7 C) BP: 123/78 Pulse Rate: 60 SpO2: 99 %   Anthropometric Measurements Height: 5\' 4"  (1.626 m) Weight: 164 lb (74.4 kg) BMI (Calculated): 28.14 Weight at Last Visit: 165lb Weight Lost Since Last Visit: 1lb Weight Gained Since Last Visit: 0 Starting Weight: 199lb Total Weight Loss (lbs): 35 lb (15.9 kg)   Body Composition  Body Fat %: 38.6 % Fat Mass (lbs): 63.6 lbs Muscle Mass (lbs): 96 lbs Total Body Water (lbs): 71.2 lbs Visceral Fat Rating : 9    No data recorded Today's Visit #: 12  Starting Date: 08/03/22   HPI  Chief Complaint: OBESITY  Lindsey Burnett is here to discuss her progress with her obesity treatment plan. She is on the the Category 2 Plan and states she is following her eating plan approximately 85 % of the time. She states she is not exercising 0 minutes 0 times per week.  Interval History:  Since last office visit she has lost 1 pound. She reports good adherence to reduced calorie nutritional plan. She has been working on increasing protein intake at every meal, making healthier choices, and thinking of starting to exercise.  She acknowledges sometimes skipping breakfast.  She is inquiring about protein shakes as a meal replacement.  She has been thinking of starting to exercise but has not started yet.  Orixegenic Control: Denies problems with appetite and hunger signals.  Denies problems with satiety and satiation.  Denies problems with eating patterns and portion control.  Denies abnormal cravings. Denies feeling deprived or restricted.   Barriers identified: physical inactivity.   Pharmacotherapy for weight loss: She is currently taking no anti-obesity medication.    ASSESSMENT AND PLAN  TREATMENT PLAN FOR OBESITY:  Recommended Dietary Goals  Lindsey Burnett is currently in the action stage of change. As such, her goal is to continue weight  management plan. She has agreed to: continue current plan  Behavioral Intervention  We discussed the following Behavioral Modification Strategies today: increasing lean protein intake, decreasing simple carbohydrates , increasing vegetables, increasing lower glycemic fruits, increasing fiber rich foods, avoiding skipping meals, increasing water intake, and planning for success.  Additional resources provided today: Handout on protein drinks and shakes and handout on protein smoothies  Recommended Physical Activity Goals  Lindsey Burnett has been advised to work up to 150 minutes of moderate intensity aerobic activity a week and strengthening exercises 2-3 times per week for cardiovascular health, weight loss maintenance and preservation of muscle mass.   She has agreed to :  Think about ways to increase daily physical activity and overcoming barriers to exercise and Start strengthening exercises with a goal of 2-3 sessions a week   Pharmacotherapy We discussed various medication options to help Lindsey Burnett with her weight loss efforts and we both agreed to : continue with nutritional and behavioral strategies  ASSOCIATED CONDITIONS ADDRESSED TODAY  Generalized obesity with starting BMI 34 Assessment & Plan: She has lost 17% of total body weight with an improvement in body fat percentage from 45% to 38% goal would be less than 34%.  Her personal weight loss goal is to reach a weight of 155 pounds.  Aside from having high blood pressure she does not have any glycemic disorders and her cholesterol profile is optimal on statin therapy.  She will continue with nutritional and behavioral strategies.  She has done better than expected with lifestyle changes alone and was congratulated  on her success.   Essential hypertension Assessment & Plan: Blood pressure is at goal.  She is currently on amlodipine and benazepril without any adverse effects.  Most recent renal parameters were reviewed to her normal GFR and  electrolytes with the exception of a mildly elevated sodium.  She denies adverse effects.  She will continue current regimen and will monitor for orthostasis as she continues to lose weight.   Low basal metabolic rate Assessment & Plan: Her most recent IC was 1397 which shows an improvement in basal metabolic rate and is comparable to calculated BMR.  We discussed ways to enhance her maintain metabolism including getting adequate sleep, increasing water intake, consuming 25 to 40 g of protein per meal and perform strengthening exercises.  She does have some dumbbells at home and was provided with a list of exercises that she can do at home.     PHYSICAL EXAM:  Blood pressure 123/78, pulse 60, temperature 98.1 F (36.7 C), height 5\' 4"  (1.626 m), weight 164 lb (74.4 kg), SpO2 99 %. Body mass index is 28.15 kg/m.  General: She is overweight, cooperative, alert, well developed, and in no acute distress. PSYCH: Has normal mood, affect and thought process.   HEENT: EOMI, sclerae are anicteric. Lungs: Normal breathing effort, no conversational dyspnea. Extremities: No edema.  Neurologic: No gross sensory or motor deficits. No tremors or fasciculations noted.    DIAGNOSTIC DATA REVIEWED:  BMET    Component Value Date/Time   NA 146 (H) 08/03/2022 0819   K 4.4 08/03/2022 0819   CL 106 08/03/2022 0819   CO2 26 08/03/2022 0819   GLUCOSE 86 08/03/2022 0819   BUN 13 08/03/2022 0819   CREATININE 0.84 08/03/2022 0819   CALCIUM 9.3 08/03/2022 0819   GFRNONAA 91 08/22/2020 1341   GFRAA 105 08/22/2020 1341   Lab Results  Component Value Date   HGBA1C 5.4 08/03/2022   HGBA1C 5.4 10/17/2019   Lab Results  Component Value Date   INSULIN 7.7 08/03/2022   Lab Results  Component Value Date   TSH 1.200 03/30/2022   CBC    Component Value Date/Time   WBC 10.8 08/03/2022 0819   RBC 4.34 08/03/2022 0819   HGB 12.5 08/03/2022 0819   HCT 37.7 08/03/2022 0819   PLT 296 08/03/2022 0819    MCV 87 08/03/2022 0819   MCH 28.8 08/03/2022 0819   MCHC 33.2 08/03/2022 0819   RDW 13.0 08/03/2022 0819   Iron Studies No results found for: "IRON", "TIBC", "FERRITIN", "IRONPCTSAT" Lipid Panel     Component Value Date/Time   CHOL 149 03/30/2022 1700   TRIG 69 03/30/2022 1700   HDL 55 03/30/2022 1700   CHOLHDL 2.7 03/30/2022 1700   LDLCALC 80 03/30/2022 1700   Hepatic Function Panel     Component Value Date/Time   PROT 6.9 08/03/2022 0819   ALBUMIN 4.0 08/03/2022 0819   AST 23 08/03/2022 0819   ALT 30 08/03/2022 0819   ALKPHOS 134 (H) 08/03/2022 0819   BILITOT 0.4 08/03/2022 0819      Component Value Date/Time   TSH 1.200 03/30/2022 1700   Nutritional Lab Results  Component Value Date   VD25OH 69.2 01/03/2023   VD25OH 17.1 (L) 08/03/2022     No follow-ups on file.Marland Kitchen She was informed of the importance of frequent follow up visits to maximize her success with intensive lifestyle modifications for her multiple health conditions.   ATTESTASTION STATEMENTS:  Reviewed by clinician on day of visit:  allergies, medications, problem list, medical history, surgical history, family history, social history, and previous encounter notes.   I have spent 30 minutes in the care of the patient today including: preparing to see patient (e.g. review and interpretation of tests, old notes ), counseling and educating the patient, documenting clinical information in the electronic or other health care record, and independently interpreting results and communicating results to the patient,family, or caregiver   Worthy Rancher, MD

## 2023-05-19 NOTE — Assessment & Plan Note (Signed)
Blood pressure is at goal.  She is currently on amlodipine and benazepril without any adverse effects.  Most recent renal parameters were reviewed to her normal GFR and electrolytes with the exception of a mildly elevated sodium.  She denies adverse effects.  She will continue current regimen and will monitor for orthostasis as she continues to lose weight.

## 2023-05-19 NOTE — Assessment & Plan Note (Signed)
She has lost 17% of total body weight with an improvement in body fat percentage from 45% to 38% goal would be less than 34%.  Her personal weight loss goal is to reach a weight of 155 pounds.  Aside from having high blood pressure she does not have any glycemic disorders and her cholesterol profile is optimal on statin therapy.  She will continue with nutritional and behavioral strategies.  She has done better than expected with lifestyle changes alone and was congratulated on her success.

## 2023-05-19 NOTE — Assessment & Plan Note (Signed)
Her most recent IC was 1397 which shows an improvement in basal metabolic rate and is comparable to calculated BMR.  We discussed ways to enhance her maintain metabolism including getting adequate sleep, increasing water intake, consuming 25 to 40 g of protein per meal and perform strengthening exercises.  She does have some dumbbells at home and was provided with a list of exercises that she can do at home.

## 2023-05-31 ENCOUNTER — Ambulatory Visit (INDEPENDENT_AMBULATORY_CARE_PROVIDER_SITE_OTHER): Payer: 59 | Admitting: Family Medicine

## 2023-05-31 ENCOUNTER — Encounter (INDEPENDENT_AMBULATORY_CARE_PROVIDER_SITE_OTHER): Payer: Self-pay | Admitting: Family Medicine

## 2023-05-31 VITALS — BP 129/82 | HR 62 | Temp 98.1°F | Ht 64.0 in | Wt 164.0 lb

## 2023-05-31 DIAGNOSIS — Z6828 Body mass index (BMI) 28.0-28.9, adult: Secondary | ICD-10-CM

## 2023-05-31 DIAGNOSIS — Z723 Lack of physical exercise: Secondary | ICD-10-CM | POA: Insufficient documentation

## 2023-05-31 DIAGNOSIS — Z636 Dependent relative needing care at home: Secondary | ICD-10-CM

## 2023-05-31 DIAGNOSIS — E669 Obesity, unspecified: Secondary | ICD-10-CM | POA: Diagnosis not present

## 2023-05-31 NOTE — Progress Notes (Signed)
Office: [phone]  /  Fax: [phone]  WEIGHT SUMMARY AND BIOMETRICS  Starting Date: 08/03/22  Starting Weight: 199lb   Weight Lost Since Last Visit: 0lb   Vitals Temp: 98.1 F (36.7 C) BP: 129/82 Pulse Rate: 62 SpO2: 100 %   Body Composition  Body Fat %: 38.5 % Fat Mass (lbs): 63.4 lbs Muscle Mass (lbs): 96.2 lbs Total Body Water (lbs): 70 lbs Visceral Fat Rating : 9     HPI  Chief Complaint: OBESITY  Lindsey Burnett is here to discuss her progress with her obesity treatment plan. She is on the the Category 2 Plan and states she is following her eating plan approximately 85 % of the time. She states she is walking 30 minutes 2 times per week.   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  This is a 17.5% total body weight loss with diet and exercise alone  Pharmacotherapy: None  PHYSICAL EXAM:  Blood pressure 129/82, pulse 62, temperature 98.1 F (36.7 C), height 5\' 4"  (1.626 m), weight 164 lb (74.4 kg), SpO2 100 %. Body mass index is 28.15 kg/m.  General: She is overweight, cooperative, alert, well developed, and in no acute distress. PSYCH: Has normal mood, affect and thought process.   Lungs: Normal breathing effort, no conversational dyspnea.   ASSESSMENT AND PLAN  TREATMENT PLAN FOR OBESITY:  Recommended Dietary Goals  Lindsey Burnett is currently in the action stage of change. As such, her goal is to continue weight management plan. She has agreed to the Category 2 Plan.  Behavioral Intervention  We  discussed the following Behavioral Modification Strategies today: increasing lean protein intake, decreasing simple carbohydrates , increasing vegetables, increasing lower glycemic fruits, increasing water intake, work on meal planning and preparation, keeping healthy foods at home, decreasing eating out or consumption of processed foods, and making healthy choices when eating convenient foods, continue to practice mindfulness when eating, and planning for success.  Additional resources provided today: NA  Recommended Physical Activity Goals  Lindsey Burnett has been advised to work up to 150 minutes of moderate intensity aerobic activity a week and strengthening exercises 2-3 times per week for cardiovascular health, weight loss maintenance and preservation of muscle mass.   She has agreed to [employer] strengthening exercises with a goal of 2-3 sessions a week  and Increase the intensity, frequency or duration of aerobic exercises    Pharmacotherapy changes for the treatment of obesity: None  ASSOCIATED CONDITIONS ADDRESSED TODAY  Caregiver stress Assessment & Plan: Patient notes that working a full-time job and caregiving for her dad have been barriers to her progress.  She has done a better job more recently on planning out meals and reduced her frequency of eating out.  She does have multiple other family members to help her with watching over her dad who does not live with her.  Continue to work on stress reduction.  Lean on family members for help so that  she may find time for meal planning and exercise.   Generalized obesity with starting BMI 34  BMI 28.0-28.9,adult  Physically inactive Assessment & Plan: Lindsey Burnett has recently added in up to 1 mile of walking several days a week.  She has not yet added in resistance training.  We discussed some body weight training exercises she may do during the course of her workday.  She agrees to trying these out for 5 to 10 minutes daily.  Will work on  increasing walking time to 2 miles 3-4 times a week.  I explained that not only will exercise help with her cardio metabolic health and maintenance of her weight loss but will help her achieve her target weight of 155 pounds.  Without adequate exercise, will be much harder for her to achieve additional weight loss.       She was informed of the importance of frequent follow up visits to maximize her success with intensive lifestyle modifications for her multiple health conditions.   ATTESTASTION STATEMENTS:  Reviewed by clinician on day of visit: allergies, medications, problem list, medical history, surgical history, family history, social history, and previous encounter notes pertinent to obesity diagnosis.   I have personally spent 30 minutes total time today in preparation, patient care, nutritional counseling and documentation for this visit, including the following: review of clinical lab tests; review of medical tests/procedures/services.      Glennis Brink, DO DABFM, DABOM [facility] [address] [place], [place] [address] [phone]

## 2023-05-31 NOTE — Assessment & Plan Note (Signed)
Lindsey Burnett has recently added in up to 1 mile of walking several days a week.  She has not yet added in resistance training.  We discussed some body weight training exercises she may do during the course of her workday.  She agrees to trying these out for 5 to 10 minutes daily.  Will work on increasing walking time to 2 miles 3-4 times a week.  I explained that not only will exercise help with her cardio metabolic health and maintenance of her weight loss but will help her achieve her target weight of 155 pounds.  Without adequate exercise, will be much harder for her to achieve additional weight loss.

## 2023-05-31 NOTE — Assessment & Plan Note (Signed)
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  She has done a better job more recently on planning out meals and reduced her frequency of eating out.  She does have multiple other family members to help her with watching over her dad who does not live with her.  Continue to work on stress reduction.  Lean on family members for help so that she may find time for meal planning and exercise.

## 2023-06-30 ENCOUNTER — Ambulatory Visit (INDEPENDENT_AMBULATORY_CARE_PROVIDER_SITE_OTHER): Payer: 59 | Admitting: Internal Medicine

## 2023-07-07 ENCOUNTER — Ambulatory Visit (INDEPENDENT_AMBULATORY_CARE_PROVIDER_SITE_OTHER): Payer: 59 | Admitting: Internal Medicine

## 2023-07-28 ENCOUNTER — Ambulatory Visit (INDEPENDENT_AMBULATORY_CARE_PROVIDER_SITE_OTHER): Payer: 59 | Admitting: Internal Medicine

## 2023-07-28 ENCOUNTER — Encounter (INDEPENDENT_AMBULATORY_CARE_PROVIDER_SITE_OTHER): Payer: Self-pay | Admitting: Internal Medicine

## 2023-07-28 VITALS — BP 134/82 | HR 65 | Temp 97.9°F | Ht 64.0 in | Wt 164.0 lb

## 2023-07-28 DIAGNOSIS — Z6834 Body mass index (BMI) 34.0-34.9, adult: Secondary | ICD-10-CM

## 2023-07-28 DIAGNOSIS — R948 Abnormal results of function studies of other organs and systems: Secondary | ICD-10-CM | POA: Diagnosis not present

## 2023-07-28 DIAGNOSIS — E669 Obesity, unspecified: Secondary | ICD-10-CM | POA: Diagnosis not present

## 2023-07-28 NOTE — Assessment & Plan Note (Signed)
She has lost 17% of total body weight with an improvement in body fat percentage from 45% to 37% goal would be less than 34%.  This time around she has maintained her body composition continues to improve.  She does not enjoy exercising and we discussed ways for her to increase physical activity levels which will help her overcome the natural slowing of her weight loss due to metabolic adaptations.    Her personal weight loss goal is to reach a weight of 155 pounds.  Aside from having high blood pressure she does not have any glycemic disorders and her cholesterol profile is optimal on statin therapy.  She will continue with nutritional and behavioral strategies.  She has done better than expected with lifestyle changes alone and was congratulated on her success.  We discussed intermittent fasting today.

## 2023-07-28 NOTE — Assessment & Plan Note (Signed)
Lindsey Burnett most recent IC was 1397 which shows an improvement in basal metabolic rate and is comparable to calculated BMR.  We discussed ways to enhance Lindsey Burnett maintain metabolism including getting adequate sleep, increasing water intake, consuming 25 to 40 g of protein per meal and perform strengthening exercises.  Intermittent fasting may help.

## 2023-07-28 NOTE — Progress Notes (Signed)
Office: [phone]  /  Fax: [phone]  WEIGHT SUMMARY AND BIOMETRICS  Vitals Temp: 97.9 F (36.6 C) BP: 134/82 Pulse Rate: 65 SpO2: 100 %   Anthropometric Measurements Height: 5\' 4"  (1.626 m) Weight: 164 lb (74.4 kg) BMI (Calculated): 28.14 Weight at Last Visit: 164 lb Weight Lost Since Last Visit: 0 lb Weight Gained Since Last Visit: 0 lb Starting Weight: 199 lb Total Weight Loss (lbs): 35 lb (15.9 kg)   Body Composition  Body Fat %: 37.4 % Fat Mass (lbs): 61.6 lbs Muscle Mass (lbs): 97.8 lbs Total Body Water (lbs): 69.6 lbs Visceral Fat Rating : 9    No data recorded Today's Visit #: 14  Starting Date: 08/04/23   HPI  Chief Complaint: OBESITY  Lindsey Burnett is here to discuss her progress with her obesity treatment plan. She is on the the Category 2 Plan and states she is following her eating plan approximately 75 % of the time. She states she is exercising 30 minutes 3 times per week.  Interval History:  Since last office visit she has maintained.  BIA suggest a further reduction in body fat percentage and increase in muscle mass. She reports good adherence to reduced calorie nutritional plan. She has been working on not skipping meals, increasing protein intake at every meal, eating more fruits, eating more vegetables, drinking more water, and making healthier choices  [name]: Denies problems with appetite and hunger signals.  Denies problems with satiety and satiation.  Denies problems with eating patterns and portion control.  Denies abnormal cravings. Denies feeling deprived or restricted.   Barriers identified:  Does not like to exercise .   Pharmacotherapy for weight loss: She is currently taking patient has declined pharmacotherapy in past.    ASSESSMENT AND PLAN  TREATMENT PLAN FOR OBESITY:  Recommended Dietary Goals  Dalynn is currently in the action stage of change. As such, her goal is to continue weight management  plan. She has agreed to: continue current plan and we discussed the benefits of intermittent fasting and was provided education on how to incorporate them parallel with current plan she has chosen an 16/8 method.  She may start a few days out of the week and then daily.  Behavioral Intervention  We discussed the following Behavioral Modification Strategies today:  Additional resources provided today:  Patient provided with handout on intermittent fasting, physical activity goals including NEAT, aerobic and strengthening.  Also given handout on causes of plateauing and ways to overcome a plateau  Recommended Physical Activity Goals  Isela has been advised to work up to 150 minutes of moderate intensity aerobic activity a week and strengthening exercises 2-3 times per week for cardiovascular health, weight loss maintenance and preservation of muscle mass.   She has agreed to :  Think about ways to increase daily physical activity and overcoming barriers to exercise  Pharmacotherapy We discussed various medication options to help Meagon with her weight loss efforts and we both agreed to : continue with nutritional and behavioral strategies  ASSOCIATED CONDITIONS ADDRESSED TODAY  Generalized obesity with starting BMI 34 Assessment & Plan: She has lost 17% of total body weight with an improvement in body fat percentage from 45% to 37% goal would be less than 34%.  This time around she has maintained her body composition continues to improve.  She does not enjoy exercising and we discussed ways for her to increase physical activity levels which will help her overcome the natural slowing of her weight  loss due to metabolic adaptations.    Her personal weight loss goal is to reach a weight of 155 pounds.  Aside from having high blood pressure she does not have any glycemic disorders and her cholesterol profile is optimal on statin therapy.  She will continue with nutritional and behavioral  strategies.  She has done better than expected with lifestyle changes alone and was congratulated on her success.  We discussed intermittent fasting today.   Low basal metabolic rate Assessment & Plan: Her most recent IC was 1397 which shows an improvement in basal metabolic rate and is comparable to calculated BMR.  We discussed ways to enhance her maintain metabolism including getting adequate sleep, increasing water intake, consuming 25 to 40 g of protein per meal and perform strengthening exercises.  Intermittent fasting may help.     PHYSICAL EXAM:  Blood pressure 134/82, pulse 65, temperature 97.9 F (36.6 C), height 5\' 4"  (1.626 m), weight 164 lb (74.4 kg), SpO2 100%. Body mass index is 28.15 kg/m.  General: She is overweight, cooperative, alert, well developed, and in no acute distress. PSYCH: Has normal mood, affect and thought process.   HEENT: EOMI, sclerae are anicteric. Lungs: Normal breathing effort, no conversational dyspnea. Extremities: No edema.  Neurologic: No gross sensory or motor deficits. No tremors or fasciculations noted.    DIAGNOSTIC DATA REVIEWED:  BMET    Component Value Date/Time   NA 146 (H) 08/03/2022 0819   K 4.4 08/03/2022 0819   CL 106 08/03/2022 0819   CO2 26 08/03/2022 0819   GLUCOSE 86 08/03/2022 0819   BUN 13 08/03/2022 0819   CREATININE 0.84 08/03/2022 0819   CALCIUM 9.3 08/03/2022 0819   GFRNONAA 91 08/22/2020 1341   GFRAA 105 08/22/2020 1341   Lab Results  Component Value Date   HGBA1C 5.4 08/03/2022   HGBA1C 5.4 10/17/2019   Lab Results  Component Value Date   INSULIN 7.7 08/03/2022   Lab Results  Component Value Date   TSH 1.200 03/30/2022   CBC    Component Value Date/Time   WBC 10.8 08/03/2022 0819   RBC 4.34 08/03/2022 0819   HGB 12.5 08/03/2022 0819   HCT 37.7 08/03/2022 0819   PLT 296 08/03/2022 0819   MCV 87 08/03/2022 0819   MCH 28.8 08/03/2022 0819   MCHC 33.2 08/03/2022 0819   RDW 13.0 08/03/2022  0819   Iron Studies No results found for: "IRON", "TIBC", "FERRITIN", "IRONPCTSAT" Lipid Panel     Component Value Date/Time   CHOL 149 03/30/2022 1700   TRIG 69 03/30/2022 1700   HDL 55 03/30/2022 1700   CHOLHDL 2.7 03/30/2022 1700   LDLCALC 80 03/30/2022 1700   Hepatic Function Panel     Component Value Date/Time   PROT 6.9 08/03/2022 0819   ALBUMIN 4.0 08/03/2022 0819   AST 23 08/03/2022 0819   ALT 30 08/03/2022 0819   ALKPHOS 134 (H) 08/03/2022 0819   BILITOT 0.4 08/03/2022 0819      Component Value Date/Time   TSH 1.200 03/30/2022 1700   Nutritional Lab Results  Component Value Date   VD25OH 69.2 01/03/2023   VD25OH 17.1 (L) 08/03/2022     Return in about 4 weeks (around 08/25/2023) for Week of Sept 30th - 4 weeks - with Dr. Rikki Spearing.Marland Kitchen She was informed of the importance of frequent follow up visits to maximize her success with intensive lifestyle modifications for her multiple health conditions.   ATTESTASTION STATEMENTS:  Reviewed by clinician on  day of visit: allergies, medications, problem list, medical history, surgical history, family history, social history, and previous encounter notes.   I have spent 30 minutes in the care of the patient today including: preparing to see patient (e.g. review and interpretation of tests, old notes ), obtaining and/or reviewing separately obtained history, performing a medically appropriate examination or evaluation, counseling and educating the patient, documenting clinical information in the electronic or other health care record, and independently interpreting results and communicating results to the patient, family, or caregiver   Worthy Rancher, MD

## 2023-07-29 ENCOUNTER — Other Ambulatory Visit: Payer: Self-pay | Admitting: Family Medicine

## 2023-07-29 DIAGNOSIS — Z0000 Encounter for general adult medical examination without abnormal findings: Secondary | ICD-10-CM

## 2023-08-29 ENCOUNTER — Ambulatory Visit
Admission: RE | Admit: 2023-08-29 | Discharge: 2023-08-29 | Disposition: A | Discharge status: Home / Self Care | Payer: 59 | Source: Ambulatory Visit | Attending: Family Medicine | Admitting: Family Medicine

## 2023-08-29 DIAGNOSIS — Z1231 Encounter for screening mammogram for malignant neoplasm of breast: Secondary | ICD-10-CM | POA: Diagnosis not present

## 2023-08-29 DIAGNOSIS — Z0000 Encounter for general adult medical examination without abnormal findings: Secondary | ICD-10-CM

## 2023-08-30 DIAGNOSIS — E663 Overweight: Secondary | ICD-10-CM | POA: Diagnosis not present

## 2023-08-30 DIAGNOSIS — M25552 Pain in left hip: Secondary | ICD-10-CM | POA: Diagnosis not present

## 2023-08-30 DIAGNOSIS — Z6827 Body mass index (BMI) 27.0-27.9, adult: Secondary | ICD-10-CM | POA: Diagnosis not present

## 2023-09-05 ENCOUNTER — Ambulatory Visit (INDEPENDENT_AMBULATORY_CARE_PROVIDER_SITE_OTHER): Payer: 59 | Admitting: Internal Medicine

## 2023-09-05 ENCOUNTER — Encounter (INDEPENDENT_AMBULATORY_CARE_PROVIDER_SITE_OTHER): Payer: Self-pay | Admitting: Internal Medicine

## 2023-09-05 VITALS — BP 127/84 | HR 69 | Temp 98.2°F | Ht 64.0 in | Wt 162.0 lb

## 2023-09-05 DIAGNOSIS — Z6827 Body mass index (BMI) 27.0-27.9, adult: Secondary | ICD-10-CM | POA: Insufficient documentation

## 2023-09-05 DIAGNOSIS — R948 Abnormal results of function studies of other organs and systems: Secondary | ICD-10-CM | POA: Diagnosis not present

## 2023-09-05 DIAGNOSIS — E669 Obesity, unspecified: Secondary | ICD-10-CM | POA: Diagnosis not present

## 2023-09-05 NOTE — Assessment & Plan Note (Signed)
Her most recent IC was 1397 which shows an improvement in basal metabolic rate and is comparable to calculated BMR.  We discussed ways to enhance her maintain metabolism including getting adequate sleep, increasing water intake, consuming 25 to 40 g of protein per meal and perform strengthening exercises.

## 2023-09-05 NOTE — Progress Notes (Signed)
Office: [phone]  /  Fax: [phone]  WEIGHT SUMMARY AND BIOMETRICS  Vitals Temp: 98.2 F (36.8 C) BP: 127/84 Pulse Rate: 69 SpO2: 100 %   Anthropometric Measurements Height: 5\' 4"  (1.626 m) Weight: 162 lb (73.5 kg) BMI (Calculated): 27.79 Weight at Last Visit: 164 lb Weight Lost Since Last Visit: 2 lb Weight Gained Since Last Visit: 0 Starting Weight: 199 lb Total Weight Loss (lbs): 37 lb (16.8 kg)   Body Composition  Body Fat %: 36.4 % Fat Mass (lbs): 59.2 lbs Muscle Mass (lbs): 98.4 lbs Total Body Water (lbs): 67.4 lbs Visceral Fat Rating : 9    No data recorded Today's Visit #: 15  Starting Date: 08/04/23   HPI  Chief Complaint: OBESITY  Lindsey Burnett is here to discuss her progress with her obesity treatment plan. She is on the the Category 2 Plan and states she is following her eating plan approximately 50 % of the time. She states she is not exercising.   Interval History:   Discussed the use of AI scribe software for clinical note transcription with the patient, who gave verbal consent to proceed.  History of Present Illness   The patient, with a history of obesity, presents for a follow-up visit. She reports fair adherence to a reduced-calorie nutrition plan but admits to not exercising regularly. Over the past month, she has lost two pounds. However, she expresses frustration with the slow pace of weight loss, feeling that she should be losing more.  Regarding her eating habits, the patient admits to struggling with maintaining her diet over the weekends. She expresses a desire to lose more weight but feels that it has become a struggle. She reports no issues with hunger control or sense of fullness.  The patient also mentions that she had started to exercise but stopped due to bad weather and the aforementioned leg pain. She expresses a dislike for exercise but acknowledges the need to increase physical activity. She also plans to focus on reducing  her carbohydrate intake and increasing her protein intake. She is considering using meal replacements for one meal a day to further reduce her calorie intake.        Pharmacotherapy for weight loss: She is currently taking no anti-obesity medication.    ASSESSMENT AND PLAN  TREATMENT PLAN FOR OBESITY:  Recommended Dietary Goals  Lindsey Burnett is currently in the action stage of change. As such, her goal is to continue weight management plan. She has agreed to: incorporate 1-2 meal replacements a day for convenience  and continue current plan  Behavioral Intervention  We discussed the following Behavioral Modification Strategies today: decreasing simple carbohydrates  and continue to work on maintaining a reduced calorie state, getting the recommended amount of protein, incorporating whole foods, making healthy choices, staying well hydrated and practicing mindfulness when eating..  Additional resources provided today: Handout on increasing daily activity and exercise goal setting  Recommended Physical Activity Goals  Lindsey Burnett has been advised to work up to 150 minutes of moderate intensity aerobic activity a week and strengthening exercises 2-3 times per week for cardiovascular health, weight loss maintenance and preservation of muscle mass.   She has agreed to :  Think about enjoyable ways to increase daily physical activity and overcoming barriers to exercise and Increase physical activity in their day and reduce sedentary time (increase NEAT).  Pharmacotherapy We discussed various medication options to help Lindsey Burnett with her weight loss efforts and we both agreed to : continue with nutritional and  behavioral strategies  ASSOCIATED CONDITIONS ADDRESSED TODAY  Generalized obesity with starting BMI 34 Assessment & Plan: She has lost 17% of total body weight with an improvement in body fat percentage from 45% to 37% goal would be less than 34%.   Her personal weight loss goal is to reach a  weight of 155 pounds.   We just we discussed tracking and journaling trying to stay within 1100 to 1200 cal, with 30% of calories coming from protein and less than 40% from carbs to maintain lower insulin levels.  She will also work on increasing volume of physical activity     BMI 27.0-27.9,adult  Low basal metabolic rate Assessment & Plan: Her most recent IC was 1397 which shows an improvement in basal metabolic rate and is comparable to calculated BMR.  We discussed ways to enhance her maintain metabolism including getting adequate sleep, increasing water intake, consuming 25 to 40 g of protein per meal and perform strengthening exercises.      PHYSICAL EXAM:  Blood pressure 127/84, pulse 69, temperature 98.2 F (36.8 C), height 5\' 4"  (1.626 m), weight 162 lb (73.5 kg), SpO2 100%. Body mass index is 27.81 kg/m.  General: She is overweight, cooperative, alert, well developed, and in no acute distress. PSYCH: Has normal mood, affect and thought process.   HEENT: EOMI, sclerae are anicteric. Lungs: Normal breathing effort, no conversational dyspnea. Extremities: No edema.  Neurologic: No gross sensory or motor deficits. No tremors or fasciculations noted.    DIAGNOSTIC DATA REVIEWED:  BMET    Component Value Date/Time   NA 146 (H) 08/03/2022 0819   K 4.4 08/03/2022 0819   CL 106 08/03/2022 0819   CO2 26 08/03/2022 0819   GLUCOSE 86 08/03/2022 0819   BUN 13 08/03/2022 0819   CREATININE 0.84 08/03/2022 0819   CALCIUM 9.3 08/03/2022 0819   GFRNONAA 91 08/22/2020 1341   GFRAA 105 08/22/2020 1341   Lab Results  Component Value Date   HGBA1C 5.4 08/03/2022   HGBA1C 5.4 10/17/2019   Lab Results  Component Value Date   INSULIN 7.7 08/03/2022   Lab Results  Component Value Date   TSH 1.200 03/30/2022   CBC    Component Value Date/Time   WBC 10.8 08/03/2022 0819   RBC 4.34 08/03/2022 0819   HGB 12.5 08/03/2022 0819   HCT 37.7 08/03/2022 0819   PLT 296  08/03/2022 0819   MCV 87 08/03/2022 0819   MCH 28.8 08/03/2022 0819   MCHC 33.2 08/03/2022 0819   RDW 13.0 08/03/2022 0819   Iron Studies No results found for: "IRON", "TIBC", "FERRITIN", "IRONPCTSAT" Lipid Panel     Component Value Date/Time   CHOL 149 03/30/2022 1700   TRIG 69 03/30/2022 1700   HDL 55 03/30/2022 1700   CHOLHDL 2.7 03/30/2022 1700   LDLCALC 80 03/30/2022 1700   Hepatic Function Panel     Component Value Date/Time   PROT 6.9 08/03/2022 0819   ALBUMIN 4.0 08/03/2022 0819   AST 23 08/03/2022 0819   ALT 30 08/03/2022 0819   ALKPHOS 134 (H) 08/03/2022 0819   BILITOT 0.4 08/03/2022 0819      Component Value Date/Time   TSH 1.200 03/30/2022 1700   Nutritional Lab Results  Component Value Date   VD25OH 69.2 01/03/2023   VD25OH 17.1 (L) 08/03/2022     Return in about 4 weeks (around 10/03/2023) for For Weight Mangement with Dr. Rikki Spearing.Marland Kitchen She was informed of the importance of frequent follow up  visits to maximize her success with intensive lifestyle modifications for her multiple health conditions.   ATTESTASTION STATEMENTS:  Reviewed by clinician on day of visit: allergies, medications, problem list, medical history, surgical history, family history, social history, and previous encounter notes.   I have spent 30 minutes in the care of the patient today including: preparing to see patient (e.g. review and interpretation of tests, old notes ), obtaining and/or reviewing separately obtained history, performing a medically appropriate examination or evaluation, counseling and educating the patient, documenting clinical information in the electronic or other health care record, and independently interpreting results and communicating results to the patient, family, or caregiver   Worthy Rancher, MD

## 2023-09-05 NOTE — Assessment & Plan Note (Signed)
She has lost 17% of total body weight with an improvement in body fat percentage from 45% to 37% goal would be less than 34%.   Her personal weight loss goal is to reach a weight of 155 pounds.   We just we discussed tracking and journaling trying to stay within 1100 to 1200 cal, with 30% of calories coming from protein and less than 40% from carbs to maintain lower insulin levels.  She will also work on increasing volume of physical activity

## 2023-09-27 DIAGNOSIS — E785 Hyperlipidemia, unspecified: Secondary | ICD-10-CM | POA: Diagnosis not present

## 2023-09-27 DIAGNOSIS — Z131 Encounter for screening for diabetes mellitus: Secondary | ICD-10-CM | POA: Diagnosis not present

## 2023-10-05 ENCOUNTER — Ambulatory Visit (INDEPENDENT_AMBULATORY_CARE_PROVIDER_SITE_OTHER): Payer: 59 | Admitting: Internal Medicine

## 2023-10-31 ENCOUNTER — Ambulatory Visit (INDEPENDENT_AMBULATORY_CARE_PROVIDER_SITE_OTHER): Payer: 59 | Admitting: Internal Medicine

## 2023-10-31 ENCOUNTER — Encounter (INDEPENDENT_AMBULATORY_CARE_PROVIDER_SITE_OTHER): Payer: Self-pay | Admitting: Internal Medicine

## 2023-10-31 VITALS — BP 122/73 | HR 70 | Temp 98.0°F | Ht 64.0 in | Wt 162.0 lb

## 2023-10-31 DIAGNOSIS — E669 Obesity, unspecified: Secondary | ICD-10-CM

## 2023-10-31 DIAGNOSIS — Z723 Lack of physical exercise: Secondary | ICD-10-CM

## 2023-10-31 DIAGNOSIS — I10 Essential (primary) hypertension: Secondary | ICD-10-CM

## 2023-10-31 DIAGNOSIS — H6691 Otitis media, unspecified, right ear: Secondary | ICD-10-CM | POA: Diagnosis not present

## 2023-10-31 DIAGNOSIS — Z6827 Body mass index (BMI) 27.0-27.9, adult: Secondary | ICD-10-CM

## 2023-10-31 DIAGNOSIS — E7800 Pure hypercholesterolemia, unspecified: Secondary | ICD-10-CM | POA: Diagnosis not present

## 2023-10-31 MED ORDER — PHENTERMINE HCL 37.5 MG PO TABS: 18.7500 mg | ORAL_TABLET | Freq: Every day | ORAL | 0 refills | Status: DC

## 2023-10-31 NOTE — Assessment & Plan Note (Signed)
She has lost 17% of total body weight with an improvement in body fat percentage from 45% to 37% goal would be less than 34%.   Her personal weight loss goal is to reach a weight of 155 pounds.   She has reached a plateau likely secondary to reaching equilibrium.  She is interested in pharmacotherapy.  After discussion of benefits and side effect she will be started on phentermine 8.75 mg once a week.

## 2023-10-31 NOTE — Assessment & Plan Note (Signed)
We again reviewed the benefits of getting regular physical activity and risk associated with sedentarism

## 2023-10-31 NOTE — Assessment & Plan Note (Signed)
Blood pressure is at goal.  She is currently on amlodipine and benazepril without any adverse effects.  Most recent renal parameters were reviewed to her normal GFR and electrolytes with the exception of a mildly elevated sodium.  She denies adverse effects.  She will continue current regimen and will monitor for orthostasis as she continues to lose weight.  Monitor for blood pressure control while starting phentermine.

## 2023-10-31 NOTE — Progress Notes (Signed)
Office: [phone]  /  Fax: [phone]  Weight Summary And Biometrics  Vitals Temp: 98 F (36.7 C) BP: 122/73 Pulse Rate: 70 SpO2: 100 %   Anthropometric Measurements Height: 5\' 4"  (1.626 m) Weight: 162 lb (73.5 kg) BMI (Calculated): 27.79 Weight at Last Visit: 162 lb Weight Lost Since Last Visit: 0 lb Weight Gained Since Last Visit: 0 lb Starting Weight: 199 lb Total Weight Loss (lbs): 37 lb (16.8 kg)   Body Composition  Body Fat %: 38 % Fat Mass (lbs): 61.6 lbs Muscle Mass (lbs): 95.4 lbs Total Body Water (lbs): 69.4 lbs Visceral Fat Rating : 9    No data recorded Today's Visit #: 16  Starting Date: 08/04/23   Subjective   Chief Complaint: Obesity  Lindsey Burnett is here to discuss her progress with her obesity treatment plan. She is on the the Category 2 Plan and states she is following her eating plan approximately 60 % of the time. She states she is not exercising  Interval History:   Since last office visit she has maintained weight. She reports good adherence to reduced calorie nutritional plan. She has been working on reading food labels, not skipping meals, increasing protein intake at every meal, drinking more water, making healthier choices, reducing portion sizes, and incorporating more whole foods   [name]:  Denies problems with appetite and hunger signals.  Denies problems with satiety and satiation.  Denies problems with eating patterns and portion control.  Denies abnormal cravings. Denies feeling deprived or restricted.   Barriers identified:  weight loss plateau .   Pharmacotherapy for weight loss: She is currently taking no anti-obesity medication.   Assessment and Plan   Treatment Plan For Obesity:  Recommended Dietary Goals  Lindsey Burnett is currently in the action stage of change. As such, her goal is to continue weight management plan. She has agreed to: continue current plan  Behavioral Intervention  We discussed the  following Behavioral Modification Strategies today: continue to work on maintaining a reduced calorie state, getting the recommended amount of protein, incorporating whole foods, making healthy choices, staying well hydrated and practicing mindfulness when eating..  Additional resources provided today: None  Recommended Physical Activity Goals  Lindsey Burnett has been advised to work up to 150 minutes of moderate intensity aerobic activity a week and strengthening exercises 2-3 times per week for cardiovascular health, weight loss maintenance and preservation of muscle mass.   She has agreed to :  Think about enjoyable ways to increase daily physical activity and overcoming barriers to exercise and Increase physical activity in their day and reduce sedentary time (increase NEAT).  Pharmacotherapy  We discussed various medication options to help Lindsey Burnett with her weight loss efforts and we both agreed to : start anti-obesity medication.  In addition to reduced calorie nutrition plan (RCNP), behavioral strategies and physical activity, Lindsey Burnett would benefit from pharmacotherapy to assist with hunger signals, satiety and cravings. This will reduce obesity-related health risks by inducing weight loss, and help reduce food consumption and adherence to [facility]) . It may also improve QOL by improving self-confidence and reduce the  setbacks associated with metabolic adaptations.  After discussion of treatment options, mechanisms of action, benefits, side effects, contraindications and shared decision making she is agreeable to starting adipex 18.75 once tablet daily. Patient also made aware that medication is indicated for long-term management of obesity and the risk of weight regain following discontinuation of treatment and hence the importance of adhering to medical weight loss plan.  Associated Conditions Addressed Today  Essential hypertension Assessment & Plan: Blood pressure is at goal.  She is currently on  amlodipine and benazepril without any adverse effects.  Most recent renal parameters were reviewed to her normal GFR and electrolytes with the exception of a mildly elevated sodium.  She denies adverse effects.  She will continue current regimen and will monitor for orthostasis as she continues to lose weight.  Monitor for blood pressure control while starting phentermine.   Pure hypercholesterolemia Assessment & Plan: LDL is close to goal.  On atorvastatin.  Elevated LDL may be secondary to nutrition, genetics and spillover effect from excess adiposity. Recommended LDL goal is <70 to reduce the risk of fatty streaks and the progression to obstructive ASCVD in the future.   Her 10 year risk is: The 10-year ASCVD risk score (Arnett DK, et al., 2019) is: 4.2%  Lab Results  Component Value Date   CHOL 149 03/30/2022   HDL 55 03/30/2022   LDLCALC 80 03/30/2022   TRIG 69 03/30/2022   CHOLHDL 2.7 03/30/2022    Continue weight loss therapy and statin therapy at current dose      Physically inactive Assessment & Plan: We again reviewed the benefits of getting regular physical activity and risk associated with sedentarism   Generalized obesity with starting BMI 34 Assessment & Plan: She has lost 17% of total body weight with an improvement in body fat percentage from 45% to 37% goal would be less than 34%.   Her personal weight loss goal is to reach a weight of 155 pounds.   She has reached a plateau likely secondary to reaching equilibrium.  She is interested in pharmacotherapy.  After discussion of benefits and side effect she will be started on phentermine 8.75 mg once a week.    Other orders -     Phentermine HCl; Take 0.5 tablets (18.75 mg total) by mouth daily before breakfast.  Dispense: 15 tablet; Refill: 0     Objective   Physical Exam:  Blood pressure 122/73, pulse 70, temperature 98 F (36.7 C), height 5\' 4"  (1.626 m), weight 162 lb (73.5 kg), SpO2 100%. Body  mass index is 27.81 kg/m.  General: She is overweight, cooperative, alert, well developed, and in no acute distress. PSYCH: Has normal mood, affect and thought process.   HEENT: EOMI, sclerae are anicteric. Lungs: Normal breathing effort, no conversational dyspnea. Extremities: No edema.  Neurologic: No gross sensory or motor deficits. No tremors or fasciculations noted.    Diagnostic Data Reviewed:  BMET    Component Value Date/Time   NA 146 (H) 08/03/2022 0819   K 4.4 08/03/2022 0819   CL 106 08/03/2022 0819   CO2 26 08/03/2022 0819   GLUCOSE 86 08/03/2022 0819   BUN 13 08/03/2022 0819   CREATININE 0.84 08/03/2022 0819   CALCIUM 9.3 08/03/2022 0819   GFRNONAA 91 08/22/2020 1341   GFRAA 105 08/22/2020 1341   Lab Results  Component Value Date   HGBA1C 5.4 08/03/2022   HGBA1C 5.4 10/17/2019   Lab Results  Component Value Date   INSULIN 7.7 08/03/2022   Lab Results  Component Value Date   TSH 1.200 03/30/2022   CBC    Component Value Date/Time   WBC 10.8 08/03/2022 0819   RBC 4.34 08/03/2022 0819   HGB 12.5 08/03/2022 0819   HCT 37.7 08/03/2022 0819   PLT 296 08/03/2022 0819   MCV 87 08/03/2022 0819   MCH 28.8 08/03/2022 0819  MCHC 33.2 08/03/2022 0819   RDW 13.0 08/03/2022 0819   Iron Studies No results found for: "IRON", "TIBC", "FERRITIN", "IRONPCTSAT" Lipid Panel     Component Value Date/Time   CHOL 149 03/30/2022 1700   TRIG 69 03/30/2022 1700   HDL 55 03/30/2022 1700   CHOLHDL 2.7 03/30/2022 1700   LDLCALC 80 03/30/2022 1700   Hepatic Function Panel     Component Value Date/Time   PROT 6.9 08/03/2022 0819   ALBUMIN 4.0 08/03/2022 0819   AST 23 08/03/2022 0819   ALT 30 08/03/2022 0819   ALKPHOS 134 (H) 08/03/2022 0819   BILITOT 0.4 08/03/2022 0819      Component Value Date/Time   TSH 1.200 03/30/2022 1700   Nutritional Lab Results  Component Value Date   VD25OH 69.2 01/03/2023   VD25OH 17.1 (L) 08/03/2022    Follow-Up   Return  in about 4 weeks (around 11/28/2023) for For Weight Mangement with Dr. Rikki Spearing.Marland Kitchen She was informed of the importance of frequent follow up visits to maximize her success with intensive lifestyle modifications for her multiple health conditions.  Attestation Statement   Reviewed by clinician on day of visit: allergies, medications, problem list, medical history, surgical history, family history, social history, and previous encounter notes.     Worthy Rancher, MD

## 2023-10-31 NOTE — Assessment & Plan Note (Signed)
LDL is close to goal.  On atorvastatin.  Elevated LDL may be secondary to nutrition, genetics and spillover effect from excess adiposity. Recommended LDL goal is <70 to reduce the risk of fatty streaks and the progression to obstructive ASCVD in the future.   Her 10 year risk is: The 10-year ASCVD risk score (Arnett DK, et al., 2019) is: 4.2%  Lab Results  Component Value Date   CHOL 149 03/30/2022   HDL 55 03/30/2022   LDLCALC 80 03/30/2022   TRIG 69 03/30/2022   CHOLHDL 2.7 03/30/2022    Continue weight loss therapy and statin therapy at current dose

## 2023-12-06 ENCOUNTER — Ambulatory Visit (INDEPENDENT_AMBULATORY_CARE_PROVIDER_SITE_OTHER): Payer: Self-pay | Admitting: Physician Assistant

## 2024-01-03 ENCOUNTER — Ambulatory Visit (INDEPENDENT_AMBULATORY_CARE_PROVIDER_SITE_OTHER): Payer: No Typology Code available for payment source | Admitting: Internal Medicine

## 2024-01-03 ENCOUNTER — Encounter (INDEPENDENT_AMBULATORY_CARE_PROVIDER_SITE_OTHER): Payer: Self-pay | Admitting: Internal Medicine

## 2024-01-03 VITALS — BP 134/82 | HR 72 | Temp 98.2°F | Ht 64.0 in | Wt 159.0 lb

## 2024-01-03 DIAGNOSIS — I10 Essential (primary) hypertension: Secondary | ICD-10-CM | POA: Diagnosis not present

## 2024-01-03 DIAGNOSIS — E669 Obesity, unspecified: Secondary | ICD-10-CM | POA: Diagnosis not present

## 2024-01-03 DIAGNOSIS — Z723 Lack of physical exercise: Secondary | ICD-10-CM | POA: Diagnosis not present

## 2024-01-03 DIAGNOSIS — Z6827 Body mass index (BMI) 27.0-27.9, adult: Secondary | ICD-10-CM | POA: Diagnosis not present

## 2024-01-03 MED ORDER — PHENTERMINE HCL 37.5 MG PO TABS: 18.7500 mg | ORAL_TABLET | Freq: Every day | ORAL | 0 refills | Status: DC

## 2024-01-03 NOTE — Assessment & Plan Note (Signed)
Peak Weight: 199 Start Date: 08/03/22 Starting Weight : 199  Current Weight: 159 Working BMR: 1370 Weight loss goal: 155 Current Nutritional: 7 day 1100 KCAL, HP, AI 01/03/2024 Past Nutritional: the Category 2 plan - 1200 kcal per day Past Pharmacotherapy: None Current Pharmacotherapy: Phentermine started Dec 2025 Contributing factors: Reduced physical activity Emotional eating tendencies present : No Screened for sleep apnea in past : Yes Associated comorbid conditions: Hypertension and Hyperlipidemia Last updated: 01/03/2024 by : EM

## 2024-01-03 NOTE — Progress Notes (Signed)
Office: [phone]  /  Fax: [phone]  Weight Summary And Biometrics  Vitals Temp: 98.2 F (36.8 C) BP: 134/82 Pulse Rate: 72 SpO2: 98 %   Anthropometric Measurements Height: 5\' 4"  (1.626 m) Weight: 159 lb (72.1 kg) BMI (Calculated): 27.28 Weight at Last Visit: 162 lb Weight Lost Since Last Visit: 3 lb Weight Gained Since Last Visit: 0 lb Starting Weight: 199 lb Total Weight Loss (lbs): 40 lb (18.1 kg)   Body Composition  Body Fat %: 37 % Fat Mass (lbs): 59 lbs Muscle Mass (lbs): 95.2 lbs Total Body Water (lbs): 66.2 lbs Visceral Fat Rating : 9    No data recorded Today's Visit #: 17  Starting Date: 08/03/22   Subjective   Chief Complaint: Obesity  Lindsey Burnett is here to discuss her progress with her obesity treatment plan. She is on the the Category 2 Plan and states she is following her eating plan approximately 85 % of the time. She states she is not exercising.  Weight Progress Since Last Visit:  Since last office visit she has lost 3 pounds. She reports  had recent lapse due to influenza She has been working on getting back on track following recent lapse   Challenges affecting patient progress: low volume of physical activity at present .   [name]: Reports improved problems with appetite and hunger signals.  Reports improved problems with satiety and satiation.  Denies problems with eating patterns and portion control.  Denies abnormal cravings. Denies feeling deprived or restricted.   Pharmacotherapy for weight management: She is currently taking Phentermine (longterm use, single agent)  with adequate clinical response  and without side effects.[name] on 10/2024  Assessment and Plan   Treatment Plan For Obesity:  Recommended Dietary Goals  Zabdi is currently in the action stage of change. As such, her goal is to continue weight management plan. She has agreed to: follow a tailored, multi-day, low carbohydrate, high protein  plan targeting 1100 calories and 90 grams of protein per day  Behavioral Health and Counseling  We discussed the following behavioral modification strategies today: continue to work on maintaining a reduced calorie state, getting the recommended amount of protein, incorporating whole foods, making healthy choices, staying well hydrated and practicing mindfulness when eating..  Additional education and resources provided today: None  Recommended Physical Activity Goals  [name] has been advised to work up to 150 minutes of moderate intensity aerobic activity a week and strengthening exercises 2-3 times per week for cardiovascular health, weight loss maintenance and preservation of muscle mass.   She has agreed to :  Think about enjoyable ways to increase daily physical activity and overcoming barriers to exercise and Increase physical activity in their day and reduce sedentary time (increase NEAT).  Pharmacotherapy  We discussed various medication options to help [name] with her weight loss efforts and we both agreed to : adequate clinical response to current dose, continue current regimen  Associated Conditions Impacted by Obesity Treatment  Essential hypertension Assessment & Plan: Blood pressure is at goal.  She is currently on amlodipine and benazepril without any adverse effects.  Check renal parameters today  Monitor for blood pressure while taking phentermine.  Orders: -     CBC with Differential/Platelet -     CMP14+EGFR -     Hemoglobin A1c -     Insulin, random -     Lipid Panel With LDL/HDL Ratio  Generalized obesity with starting BMI 34 Assessment & Plan: Peak Weight: 199  Start Date: 08/03/22 Starting Weight : 199  Current Weight: 159 Working BMR: 1370 Weight loss goal: 155 Current Nutritional: 7 day 1100 KCAL, HP, AI 01/03/2024 Past Nutritional: the Category 2 plan - 1200 kcal per day Past Pharmacotherapy: None Current Pharmacotherapy: Phentermine started Dec  2025 Contributing factors: Reduced physical activity Emotional eating tendencies present : No Screened for sleep apnea in past : Yes Associated comorbid conditions: Hypertension and Hyperlipidemia Last updated: 01/03/2024 by : EM   Orders: -     Hemoglobin A1c -     Insulin, random -     Lipid Panel With LDL/HDL Ratio -     VITAMIN D 25 Hydroxy (Vit-D Deficiency, Fractures)  Physically inactive Assessment & Plan: Patient counseled on the benefits of physical activity.  She acknowledges not liking to exercise.  She has a calculated basal metabolic rate of 5784 in order to continue to lose 1 pound a week she would need to generate a 500-calorie deficit which would be difficult with diet alone.   BMI 27.0-27.9,adult  Other orders -     Phentermine HCl; Take 0.5 tablets (18.75 mg total) by mouth daily before breakfast.  Dispense: 15 tablet; Refill: 0     Objective   Physical Exam:  Blood pressure 134/82, pulse 72, temperature 98.2 F (36.8 C), height 5\' 4"  (1.626 m), weight 159 lb (72.1 kg), SpO2 98%. Body mass index is 27.29 kg/m.  General: She is overweight, cooperative, alert, well developed, and in no acute distress. PSYCH: Has normal mood, affect and thought process.   HEENT: EOMI, sclerae are anicteric. Lungs: Normal breathing effort, no conversational dyspnea. Extremities: No edema.  Neurologic: No gross sensory or motor deficits. No tremors or fasciculations noted.    Diagnostic Data Reviewed:  BMET    Component Value Date/Time   NA 146 (H) 08/03/2022 0819   K 4.4 08/03/2022 0819   CL 106 08/03/2022 0819   CO2 26 08/03/2022 0819   GLUCOSE 86 08/03/2022 0819   BUN 13 08/03/2022 0819   CREATININE 0.84 08/03/2022 0819   CALCIUM 9.3 08/03/2022 0819   GFRNONAA 91 08/22/2020 1341   GFRAA 105 08/22/2020 1341   Lab Results  Component Value Date   HGBA1C 5.4 08/03/2022   HGBA1C 5.4 10/17/2019   Lab Results  Component Value Date   INSULIN 7.7 08/03/2022    Lab Results  Component Value Date   TSH 1.200 03/30/2022   CBC    Component Value Date/Time   WBC 10.8 08/03/2022 0819   RBC 4.34 08/03/2022 0819   HGB 12.5 08/03/2022 0819   HCT 37.7 08/03/2022 0819   PLT 296 08/03/2022 0819   MCV 87 08/03/2022 0819   MCH 28.8 08/03/2022 0819   MCHC 33.2 08/03/2022 0819   RDW 13.0 08/03/2022 0819   Iron Studies No results found for: "IRON", "TIBC", "FERRITIN", "IRONPCTSAT" Lipid Panel     Component Value Date/Time   CHOL 149 03/30/2022 1700   TRIG 69 03/30/2022 1700   HDL 55 03/30/2022 1700   CHOLHDL 2.7 03/30/2022 1700   LDLCALC 80 03/30/2022 1700   Hepatic Function Panel     Component Value Date/Time   PROT 6.9 08/03/2022 0819   ALBUMIN 4.0 08/03/2022 0819   AST 23 08/03/2022 0819   ALT 30 08/03/2022 0819   ALKPHOS 134 (H) 08/03/2022 0819   BILITOT 0.4 08/03/2022 0819      Component Value Date/Time   TSH 1.200 03/30/2022 1700   Nutritional Lab Results  Component Value  Date   VD25OH 69.2 01/03/2023   VD25OH 17.1 (L) 08/03/2022    Follow-Up   No follow-ups on file.Marland Kitchen She was informed of the importance of frequent follow up visits to maximize her success with intensive lifestyle modifications for her multiple health conditions.  Attestation Statement   Reviewed by clinician on day of visit: allergies, medications, problem list, medical history, surgical history, family history, social history, and previous encounter notes.     Worthy Rancher, MD

## 2024-01-03 NOTE — Assessment & Plan Note (Signed)
Blood pressure is at goal.  She is currently on amlodipine and benazepril without any adverse effects.  Check renal parameters today  Monitor for blood pressure while taking phentermine.

## 2024-01-03 NOTE — Assessment & Plan Note (Signed)
Patient counseled on the benefits of physical activity.  She acknowledges not liking to exercise.  She has a calculated basal metabolic rate of 1610 in order to continue to lose 1 pound a week she would need to generate a 500-calorie deficit which would be difficult with diet alone.

## 2024-01-04 ENCOUNTER — Encounter (INDEPENDENT_AMBULATORY_CARE_PROVIDER_SITE_OTHER): Payer: Self-pay | Admitting: Internal Medicine

## 2024-01-04 LAB — LIPID PANEL WITH LDL/HDL RATIO
Cholesterol, Total: 144 mg/dL (ref 100–199)
HDL: 45 mg/dL (ref >39)
LDL Chol Calc (NIH): 86 mg/dL (ref 0–99)
LDL/HDL Ratio: 1.9 {ratio} (ref 0.0–3.2)
Triglycerides: 64 mg/dL (ref 0–149)
VLDL Cholesterol Cal: 13 mg/dL (ref 5–40)

## 2024-01-04 LAB — CMP14+EGFR
ALT: 16 [IU]/L (ref 0–32)
AST: 22 [IU]/L (ref 0–40)
Albumin: 4.4 g/dL (ref 3.8–4.9)
Alkaline Phosphatase: 78 [IU]/L (ref 44–121)
BUN/Creatinine Ratio: 16 (ref 12–28)
BUN: 11 mg/dL (ref 8–27)
Bilirubin Total: 0.6 mg/dL (ref 0.0–1.2)
CO2: 26 mmol/L (ref 20–29)
Calcium: 9.2 mg/dL (ref 8.7–10.3)
Chloride: 105 mmol/L (ref 96–106)
Creatinine, Ser: 0.69 mg/dL (ref 0.57–1.00)
Globulin, Total: 3.2 g/dL (ref 1.5–4.5)
Glucose: 92 mg/dL (ref 70–99)
Potassium: 4.4 mmol/L (ref 3.5–5.2)
Sodium: 142 mmol/L (ref 134–144)
Total Protein: 7.6 g/dL (ref 6.0–8.5)
eGFR: 99 mL/min/{1.73_m2} (ref >59)

## 2024-01-04 LAB — CBC WITH DIFFERENTIAL/PLATELET
Basophils Absolute: 0 10*3/uL (ref 0.0–0.2)
Basos: 1 %
EOS (ABSOLUTE): 0 10*3/uL (ref 0.0–0.4)
Eos: 1 %
Hematocrit: 37.9 % (ref 34.0–46.6)
Hemoglobin: 12 g/dL (ref 11.1–15.9)
Immature Grans (Abs): 0 10*3/uL (ref 0.0–0.1)
Immature Granulocytes: 0 %
Lymphocytes Absolute: 1.4 10*3/uL (ref 0.7–3.1)
Lymphs: 44 %
MCH: 28.2 pg (ref 26.6–33.0)
MCHC: 31.7 g/dL (ref 31.5–35.7)
MCV: 89 fL (ref 79–97)
Monocytes Absolute: 0.3 10*3/uL (ref 0.1–0.9)
Monocytes: 9 %
Neutrophils Absolute: 1.4 10*3/uL (ref 1.4–7.0)
Neutrophils: 45 %
Platelets: 244 10*3/uL (ref 150–450)
RBC: 4.25 x10E6/uL (ref 3.77–5.28)
RDW: 13.2 % (ref 11.7–15.4)
WBC: 3.2 10*3/uL — ABNORMAL LOW (ref 3.4–10.8)

## 2024-01-04 LAB — INSULIN, RANDOM: INSULIN: 7.6 u[IU]/mL (ref 2.6–24.9)

## 2024-01-04 LAB — VITAMIN D 25 HYDROXY (VIT D DEFICIENCY, FRACTURES): Vit D, 25-Hydroxy: 64.6 ng/mL (ref 30.0–100.0)

## 2024-01-04 LAB — HEMOGLOBIN A1C
Est. average glucose Bld gHb Est-mCnc: 105 mg/dL
Hgb A1c MFr Bld: 5.3 % (ref 4.8–5.6)

## 2024-02-07 ENCOUNTER — Ambulatory Visit (INDEPENDENT_AMBULATORY_CARE_PROVIDER_SITE_OTHER): Payer: No Typology Code available for payment source | Admitting: Internal Medicine

## 2024-02-07 ENCOUNTER — Encounter (INDEPENDENT_AMBULATORY_CARE_PROVIDER_SITE_OTHER): Payer: Self-pay | Admitting: Internal Medicine

## 2024-02-07 VITALS — BP 127/82 | HR 80 | Temp 97.9°F | Ht 64.0 in | Wt 157.0 lb

## 2024-02-07 DIAGNOSIS — E669 Obesity, unspecified: Secondary | ICD-10-CM | POA: Diagnosis not present

## 2024-02-07 DIAGNOSIS — Z6826 Body mass index (BMI) 26.0-26.9, adult: Secondary | ICD-10-CM

## 2024-02-07 DIAGNOSIS — E7800 Pure hypercholesterolemia, unspecified: Secondary | ICD-10-CM | POA: Diagnosis not present

## 2024-02-07 DIAGNOSIS — I10 Essential (primary) hypertension: Secondary | ICD-10-CM

## 2024-02-07 MED ORDER — PHENTERMINE HCL 37.5 MG PO TABS: 18.7500 mg | ORAL_TABLET | Freq: Every day | ORAL | 0 refills | Status: DC

## 2024-02-07 NOTE — Assessment & Plan Note (Addendum)
 Peak Weight: 199 Start Date: 08/03/22 Starting Weight : 199  Current Weight: 157 Working BMR: 1370 Weight loss goal: 155 Current Nutritional: 7 day 1100 KCAL, HP, AI 01/03/2024 Past Nutritional: the Category 2 plan - 1200 kcal per day Past Pharmacotherapy: None Current Pharmacotherapy: Phentermine started Dec 2025 Contributing factors: Reduced physical activity Emotional eating tendencies present : No Screened for sleep apnea in past : Yes Associated comorbid conditions: Hypertension and Hyperlipidemia Last updated: 01/03/2024 by : EM  Since starting her program she has lost approximately 42 pounds or 26% of total body weight but considering her peak weight it is close to 70 pounds.  She has not been exercising and again we emphasized the importance of physical activity especially for weight loss maintenance.  She is close to her goal weight.  Continue current weight management strategy inclusive of antiobesity medication.

## 2024-02-07 NOTE — Assessment & Plan Note (Signed)
 LDL is close to goal.  On atorvastatin.    Her 10 year risk is: The 10-year ASCVD risk score (Arnett DK, et al., 2019) is: 6.5%  Lab Results  Component Value Date   CHOL 144 01/03/2024   HDL 45 01/03/2024   LDLCALC 86 01/03/2024   TRIG 64 01/03/2024   CHOLHDL 2.7 03/30/2022    Continue weight loss therapy and statin therapy at current dose

## 2024-02-07 NOTE — Progress Notes (Signed)
 Office: [phone]  /  Fax: [phone]  Weight Summary And Biometrics  Vitals Temp: 97.9 F (36.6 C) BP: 127/82 Pulse Rate: 80 SpO2: 99 %   Anthropometric Measurements Height: 5\' 4"  (1.626 m) Weight: 157 lb (71.2 kg) BMI (Calculated): 26.94 Weight at Last Visit: 159 lb Weight Lost Since Last Visit: 2 lb Weight Gained Since Last Visit: 0 Starting Weight: 199 lb Total Weight Loss (lbs): 42 lb (19.1 kg) Peak Weight: 230 lb   Body Composition  Body Fat %: 36.6 % Fat Mass (lbs): 57.6 lbs Muscle Mass (lbs): 95 lbs Total Body Water (lbs): 66.6 lbs Visceral Fat Rating : 9    No data recorded Today's Visit #: 18  Starting Date: 08/03/22   Subjective   Chief Complaint: Obesity  Lindsey Burnett is here to discuss her progress with her obesity treatment plan. She is on the the Category 2 Plan and states she is following her eating plan approximately 60-70% of the time. She states she is not exercising  Weight Progress Since Last Visit:  Since last office visit she has lost 2 pounds. She reports good adherence to reduced calorie nutritional plan. She has been working on reading food labels, not skipping meals, increasing protein intake at every meal, drinking more water, making healthier choices, reducing portion sizes, and incorporating more whole foods   Challenges affecting patient progress: low volume of physical activity at present  and caregiver responsabilities .   [name]: Denies problems with appetite and hunger signals.  Denies problems with satiety and satiation.  Denies problems with eating patterns and portion control.  Denies abnormal cravings. Denies feeling deprived or restricted.   Pharmacotherapy for weight management: She is currently taking Phentermine (longterm use, single agent)  with adequate clinical response  and without side effects..   Assessment and Plan   Treatment Plan For Obesity:  Recommended Dietary Goals  Lindsey Burnett is  currently in the action stage of change. As such, her goal is to continue weight management plan. She has agreed to: continue current plan  Behavioral Health and Counseling  We discussed the following behavioral modification strategies today: continue to work on maintaining a reduced calorie state, getting the recommended amount of protein, incorporating whole foods, making healthy choices, staying well hydrated and practicing mindfulness when eating..  Additional education and resources provided today: None  Recommended Physical Activity Goals  [name] has been advised to work up to 150 minutes of moderate intensity aerobic activity a week and strengthening exercises 2-3 times per week for cardiovascular health, weight loss maintenance and preservation of muscle mass.   She has agreed to :  Think about enjoyable ways to increase daily physical activity and overcoming barriers to exercise and Increase physical activity in their day and reduce sedentary time (increase NEAT).  Pharmacotherapy  We discussed various medication options to help [name] with her weight loss efforts and we both agreed to : adequate clinical response to current dose, continue current regimen  Associated Conditions Impacted by Obesity Treatment  Essential hypertension Assessment & Plan: Blood pressure is at goal.  She is currently on amlodipine and benazepril without any adverse effects.  Reviewed most recent renal parameters and they are within normal limits.  Monitor for blood pressure while taking phentermine.   Pure hypercholesterolemia Assessment & Plan: LDL is close to goal.  On atorvastatin.    Her 10 year risk is: The 10-year ASCVD risk score (Arnett DK, et al., 2019) is: 6.5%  Lab Results  Component Value  Date   CHOL 144 01/03/2024   HDL 45 01/03/2024   LDLCALC 86 01/03/2024   TRIG 64 01/03/2024   CHOLHDL 2.7 03/30/2022    Continue weight loss therapy and statin therapy at current  dose      Generalized obesity with starting BMI 34 Assessment & Plan: Peak Weight: 199 Start Date: 08/03/22 Starting Weight : 199  Current Weight: 157 Working BMR: 1370 Weight loss goal: 155 Current Nutritional: 7 day 1100 KCAL, HP, AI 01/03/2024 Past Nutritional: the Category 2 plan - 1200 kcal per day Past Pharmacotherapy: None Current Pharmacotherapy: Phentermine started Dec 2025 Contributing factors: Reduced physical activity Emotional eating tendencies present : No Screened for sleep apnea in past : Yes Associated comorbid conditions: Hypertension and Hyperlipidemia Last updated: 01/03/2024 by : EM  Since starting her program she has lost approximately 42 pounds or 26% of total body weight but considering her peak weight it is close to 70 pounds.  She has not been exercising and again we emphasized the importance of physical activity especially for weight loss maintenance.  She is close to her goal weight.  Continue current weight management strategy inclusive of antiobesity medication.    Other orders -     Phentermine HCl; Take 0.5 tablets (18.75 mg total) by mouth daily before breakfast.  Dispense: 15 tablet; Refill: 0     Objective   Physical Exam:  Blood pressure 127/82, pulse 80, temperature 97.9 F (36.6 C), height 5\' 4"  (1.626 m), weight 157 lb (71.2 kg), SpO2 99%. Body mass index is 26.95 kg/m.  General: She is overweight, cooperative, alert, well developed, and in no acute distress. PSYCH: Has normal mood, affect and thought process.   HEENT: EOMI, sclerae are anicteric. Lungs: Normal breathing effort, no conversational dyspnea. Extremities: No edema.  Neurologic: No gross sensory or motor deficits. No tremors or fasciculations noted.    Diagnostic Data Reviewed:  BMET    Component Value Date/Time   NA 142 01/03/2024 0808   K 4.4 01/03/2024 0808   CL 105 01/03/2024 0808   CO2 26 01/03/2024 0808   GLUCOSE 92 01/03/2024 0808   BUN 11 01/03/2024  0808   CREATININE 0.69 01/03/2024 0808   CALCIUM 9.2 01/03/2024 0808   GFRNONAA 91 08/22/2020 1341   GFRAA 105 08/22/2020 1341   Lab Results  Component Value Date   HGBA1C 5.3 01/03/2024   HGBA1C 5.4 10/17/2019   Lab Results  Component Value Date   INSULIN 7.6 01/03/2024   INSULIN 7.7 08/03/2022   Lab Results  Component Value Date   TSH 1.200 03/30/2022   CBC    Component Value Date/Time   WBC 3.2 (L) 01/03/2024 0808   RBC 4.25 01/03/2024 0808   HGB 12.0 01/03/2024 0808   HCT 37.9 01/03/2024 0808   PLT 244 01/03/2024 0808   MCV 89 01/03/2024 0808   MCH 28.2 01/03/2024 0808   MCHC 31.7 01/03/2024 0808   RDW 13.2 01/03/2024 0808   Iron Studies No results found for: "IRON", "TIBC", "FERRITIN", "IRONPCTSAT" Lipid Panel     Component Value Date/Time   CHOL 144 01/03/2024 0808   TRIG 64 01/03/2024 0808   HDL 45 01/03/2024 0808   CHOLHDL 2.7 03/30/2022 1700   LDLCALC 86 01/03/2024 0808   Hepatic Function Panel     Component Value Date/Time   PROT 7.6 01/03/2024 0808   ALBUMIN 4.4 01/03/2024 0808   AST 22 01/03/2024 0808   ALT 16 01/03/2024 0808   ALKPHOS 78 01/03/2024  9528   BILITOT 0.6 01/03/2024 0808      Component Value Date/Time   TSH 1.200 03/30/2022 1700   Nutritional Lab Results  Component Value Date   VD25OH 64.6 01/03/2024   VD25OH 69.2 01/03/2023   VD25OH 17.1 (L) 08/03/2022    Medications: Outpatient Encounter Medications as of 02/07/2024  Medication Sig   amLODipine-benazepril (LOTREL) 5-10 MG capsule Take 1 capsule by mouth daily.   aspirin EC 81 MG tablet Take 81 mg by mouth daily. Swallow whole.   atorvastatin (LIPITOR) 20 MG tablet Take 20 mg by mouth daily.   naproxen (NAPROSYN) 375 MG tablet Take 375 mg by mouth 2 (two) times daily as needed.   triamcinolone 0.1%-Eucerin equivalent 1:1 cream mixture Apply topically 3 (three) times daily as needed.   Vitamin D, Ergocalciferol, (DRISDOL) 1.25 MG (50000 UNIT) CAPS capsule Take 50,000  Units by mouth once a week.   [DISCONTINUED] phentermine (ADIPEX-P) 37.5 MG tablet Take 0.5 tablets (18.75 mg total) by mouth daily before breakfast.   phentermine (ADIPEX-P) 37.5 MG tablet Take 0.5 tablets (18.75 mg total) by mouth daily before breakfast.   No facility-administered encounter medications on file as of 02/07/2024.     Follow-Up   Return in about 4 weeks (around 03/06/2024) for For Weight Mangement with Dr. Rikki Spearing.Marland Kitchen She was informed of the importance of frequent follow up visits to maximize her success with intensive lifestyle modifications for her multiple health conditions.  Attestation Statement   Reviewed by clinician on day of visit: allergies, medications, problem list, medical history, surgical history, family history, social history, and previous encounter notes.     Worthy Rancher, MD

## 2024-02-07 NOTE — Assessment & Plan Note (Signed)
 Blood pressure is at goal.  She is currently on amlodipine and benazepril without any adverse effects.  Reviewed most recent renal parameters and they are within normal limits.  Monitor for blood pressure while taking phentermine.

## 2024-03-08 ENCOUNTER — Encounter (INDEPENDENT_AMBULATORY_CARE_PROVIDER_SITE_OTHER): Payer: Self-pay | Admitting: Internal Medicine

## 2024-03-08 ENCOUNTER — Ambulatory Visit (INDEPENDENT_AMBULATORY_CARE_PROVIDER_SITE_OTHER): Admitting: Internal Medicine

## 2024-03-08 VITALS — BP 138/90 | HR 82 | Temp 98.3°F | Ht 64.0 in | Wt 157.0 lb

## 2024-03-08 DIAGNOSIS — Z6826 Body mass index (BMI) 26.0-26.9, adult: Secondary | ICD-10-CM | POA: Diagnosis not present

## 2024-03-08 DIAGNOSIS — I10 Essential (primary) hypertension: Secondary | ICD-10-CM | POA: Diagnosis not present

## 2024-03-08 DIAGNOSIS — E7800 Pure hypercholesterolemia, unspecified: Secondary | ICD-10-CM

## 2024-03-08 DIAGNOSIS — E669 Obesity, unspecified: Secondary | ICD-10-CM | POA: Diagnosis not present

## 2024-03-08 MED ORDER — PHENTERMINE HCL 37.5 MG PO TABS: 18.7500 mg | ORAL_TABLET | Freq: Every day | ORAL | 1 refills | Status: DC

## 2024-03-08 NOTE — Assessment & Plan Note (Signed)
 Her blood pressure today is elevated.  She is currently on amlodipine and benazepril.  Has been on phentermine since December and this is the first time worsening elevations in her blood pressure.  I recommend that she monitor her blood pressure in the morning at noon time and also before bedtime over the next few days if her blood pressure remains elevated we will change to Lomaira or diethylproprion

## 2024-03-08 NOTE — Assessment & Plan Note (Signed)
 LDL is close to goal.  On atorvastatin.    Her 10 year risk is: The 10-year ASCVD risk score (Arnett DK, et al., 2019) is: 7%  Lab Results  Component Value Date   CHOL 144 01/03/2024   HDL 45 01/03/2024   LDLCALC 86 01/03/2024   TRIG 64 01/03/2024   CHOLHDL 2.7 03/30/2022    Continue weight loss therapy and statin therapy at current dose

## 2024-03-08 NOTE — Progress Notes (Signed)
 Office: [phone]  /  Fax: [phone]  Weight Summary And Biometrics  Vitals Temp: 98.3 F (36.8 C) BP: (!) 138/90 Pulse Rate: 82 SpO2: 100 %   Anthropometric Measurements Height: 5\' 4"  (1.626 m) Weight: 157 lb (71.2 kg) BMI (Calculated): 26.94 Weight at Last Visit: 157 lb Weight Lost Since Last Visit: 0 lb Weight Gained Since Last Visit: 0 lb Starting Weight: 199 lb Total Weight Loss (lbs): 42 lb (19.1 kg) Peak Weight: 230 lb   Body Composition  Body Fat %: 36.8 % Fat Mass (lbs): 57.8 lbs Muscle Mass (lbs): 94.2 lbs Total Body Water (lbs): 66.6 lbs Visceral Fat Rating : 9    No data recorded Today's Visit #: 19  Starting Date: 08/03/22   Subjective   Chief Complaint: Obesity  Interval History Discussed the use of AI scribe software for clinical note transcription with the patient, who gave verbal consent to proceed.  History of Present Illness   Lindsey Burnett is a 58 year old female who presents for follow-up on weight management and phentermine use.  She has been taking phentermine since December, with a dosage of 18.75 mg once daily in the morning. No side effects from the medication are reported, although she experiences sleep disturbances attributed to stress.  Since starting phentermine, she has lost over 42 pounds, reducing her weight from 230 pounds to her current weight. Her body fat percentage is now 36%, and her BMI is 26. She has maintained her weight between March and April and is currently at a healthier weight.  She consumes between 900 to 1100 calories daily, relying on intuition rather than actively tracking her intake. Her basal metabolic rate is approximately 1354 calories. Despite not engaging in regular exercise and feeling a lack of motivation, she wants to lose more weight and recognizes the need to recalibrate her calorie intake to overcome her current weight plateau.  She has been monitoring her blood pressure, as  phentermine can cause elevations. She has no history of hypertension, and her last blood work in February showed good results, including normal vitamin D, cholesterol, kidney function, electrolytes, A1c of 5.3, and insulin level of 7.6.       Challenges affecting patient progress:  skipping of meals .    Pharmacotherapy for weight management: She is currently taking Phentermine (longterm use, single agent)  with adequate clinical response  and experiencing the following side effects: elevation in BP today..   Assessment and Plan   Treatment Plan For Obesity:  Recommended Dietary Goals  Sehaj is currently in the action stage of change. As such, her goal is to continue weight management plan. She has agreed to: follow the Category 1 plan - 1000 kcal per day  Behavioral Health and Counseling  We discussed the following behavioral modification strategies today: increasing lean protein intake to established goals, avoiding skipping meals, increasing water intake , work on meal planning and preparation, work on tracking and journaling calories using tracking application, and continue to work on maintaining a reduced calorie state, getting the recommended amount of protein, incorporating whole foods, making healthy choices, staying well hydrated and practicing mindfulness when eating..  Additional education and resources provided today: Handout and personalized instruction on tracking and journaling using Apps and Cat1 plan  Recommended Physical Activity Goals  [name] has been advised to work up to 150 minutes of moderate intensity aerobic activity a week and strengthening exercises 2-3 times per week for cardiovascular health, weight loss maintenance and preservation of muscle mass.  She has agreed to :  Think about enjoyable ways to increase daily physical activity and overcoming barriers to exercise and Increase physical activity in their day and reduce sedentary time (increase  NEAT).  Pharmacotherapy  We discussed various medication options to help Merlinda with her weight loss efforts and we both agreed to : adequate clinical response to anti-obesity medication, continue current regimen  Associated Conditions Impacted by Obesity Treatment  Essential hypertension Assessment & Plan: Her blood pressure today is elevated.  She is currently on amlodipine and benazepril.  Has been on phentermine since December and this is the first time worsening elevations in her blood pressure.  I recommend that she monitor her blood pressure in the morning at noon time and also before bedtime over the next few days if her blood pressure remains elevated we will change to Lomaira or diethylproprion   Generalized obesity with starting BMI 34 Assessment & Plan: She has been on phentermine since December, achieving a weight loss of over 42 pounds from 230 pounds, with a current BMI of 26 and body fat percentage of 36%. She reports no side effects from the medication and attributes sleep issues to stress. The potential for phentermine to cause insomnia was discussed, but she does not believe it affects her sleep. Emphasis was placed on maintaining a calorie intake between 1000 and 1354 calories to continue weight loss without excessive restriction. - Continue phentermine 18.75 mg, one tablet in the morning - Track calorie intake using My Net Diary app - Follow a 1000 calorie meal plan with 80-90 grams of protein - Consider meal replacement options like Organe, Fairlife, or Premier Protein for one meal   Pure hypercholesterolemia Assessment & Plan: LDL is close to goal.  On atorvastatin.    Her 10 year risk is: The 10-year ASCVD risk score (Arnett DK, et al., 2019) is: 7%  Lab Results  Component Value Date   CHOL 144 01/03/2024   HDL 45 01/03/2024   LDLCALC 86 01/03/2024   TRIG 64 01/03/2024   CHOLHDL 2.7 03/30/2022    Continue weight loss therapy and statin therapy at current  dose      Other orders -     Phentermine HCl; Take 0.5 tablets (18.75 mg total) by mouth daily before breakfast.  Dispense: 15 tablet; Refill: 1     General Health Maintenance February blood work showed normal results, including vitamin D, cholesterol, kidney function, electrolytes, A1c, and insulin levels.  Follow-up Follow-up is necessary to evaluate treatment effectiveness and monitor blood pressure. - Schedule follow-up appointment in 5-6 weeks - Send prescription refill to [facility] with potential change if blood pressure remains elevated         Objective   Physical Exam:  Blood pressure (!) 138/90, pulse 82, temperature 98.3 F (36.8 C), height 5\' 4"  (1.626 m), weight 157 lb (71.2 kg), SpO2 100%. Body mass index is 26.95 kg/m.  General: She is overweight, cooperative, alert, well developed, and in no acute distress. PSYCH: Has normal mood, affect and thought process.   HEENT: EOMI, sclerae are anicteric. Lungs: Normal breathing effort, no conversational dyspnea. Extremities: No edema.  Neurologic: No gross sensory or motor deficits. No tremors or fasciculations noted.    Diagnostic Data Reviewed:  BMET    Component Value Date/Time   NA 142 01/03/2024 0808   K 4.4 01/03/2024 0808   CL 105 01/03/2024 0808   CO2 26 01/03/2024 0808   GLUCOSE 92 01/03/2024 0808   BUN 11 01/03/2024  0981   CREATININE 0.69 01/03/2024 0808   CALCIUM 9.2 01/03/2024 0808   GFRNONAA 91 08/22/2020 1341   GFRAA 105 08/22/2020 1341   Lab Results  Component Value Date   HGBA1C 5.3 01/03/2024   HGBA1C 5.4 10/17/2019   Lab Results  Component Value Date   INSULIN 7.6 01/03/2024   INSULIN 7.7 08/03/2022   Lab Results  Component Value Date   TSH 1.200 03/30/2022   CBC    Component Value Date/Time   WBC 3.2 (L) 01/03/2024 0808   RBC 4.25 01/03/2024 0808   HGB 12.0 01/03/2024 0808   HCT 37.9 01/03/2024 0808   PLT 244 01/03/2024 0808   MCV 89 01/03/2024 0808   MCH 28.2  01/03/2024 0808   MCHC 31.7 01/03/2024 0808   RDW 13.2 01/03/2024 0808   Iron Studies No results found for: "IRON", "TIBC", "FERRITIN", "IRONPCTSAT" Lipid Panel     Component Value Date/Time   CHOL 144 01/03/2024 0808   TRIG 64 01/03/2024 0808   HDL 45 01/03/2024 0808   CHOLHDL 2.7 03/30/2022 1700   LDLCALC 86 01/03/2024 0808   Hepatic Function Panel     Component Value Date/Time   PROT 7.6 01/03/2024 0808   ALBUMIN 4.4 01/03/2024 0808   AST 22 01/03/2024 0808   ALT 16 01/03/2024 0808   ALKPHOS 78 01/03/2024 0808   BILITOT 0.6 01/03/2024 0808      Component Value Date/Time   TSH 1.200 03/30/2022 1700   Nutritional Lab Results  Component Value Date   VD25OH 64.6 01/03/2024   VD25OH 69.2 01/03/2023   VD25OH 17.1 (L) 08/03/2022    Medications: Outpatient Encounter Medications as of 03/08/2024  Medication Sig   amLODipine-benazepril (LOTREL) 5-10 MG capsule Take 1 capsule by mouth daily.   aspirin EC 81 MG tablet Take 81 mg by mouth daily. Swallow whole.   atorvastatin (LIPITOR) 20 MG tablet Take 20 mg by mouth daily.   naproxen (NAPROSYN) 375 MG tablet Take 375 mg by mouth 2 (two) times daily as needed.   triamcinolone 0.1%-Eucerin equivalent 1:1 cream mixture Apply topically 3 (three) times daily as needed.   Vitamin D, Ergocalciferol, (DRISDOL) 1.25 MG (50000 UNIT) CAPS capsule Take 50,000 Units by mouth once a week.   [DISCONTINUED] phentermine (ADIPEX-P) 37.5 MG tablet Take 0.5 tablets (18.75 mg total) by mouth daily before breakfast.   phentermine (ADIPEX-P) 37.5 MG tablet Take 0.5 tablets (18.75 mg total) by mouth daily before breakfast.   No facility-administered encounter medications on file as of 03/08/2024.     Follow-Up   Return in about 4 weeks (around 04/05/2024) for For Weight Mangement with Dr. Allie Area.Aaron Aas She was informed of the importance of frequent follow up visits to maximize her success with intensive lifestyle modifications for her multiple health  conditions.  Attestation Statement   Reviewed by clinician on day of visit: allergies, medications, problem list, medical history, surgical history, family history, social history, and previous encounter notes.     Ladd Picker, MD

## 2024-03-08 NOTE — Assessment & Plan Note (Signed)
 She has been on phentermine since December, achieving a weight loss of over 42 pounds from 230 pounds, with a current BMI of 26 and body fat percentage of 36%. She reports no side effects from the medication and attributes sleep issues to stress. The potential for phentermine to cause insomnia was discussed, but she does not believe it affects her sleep. Emphasis was placed on maintaining a calorie intake between 1000 and 1354 calories to continue weight loss without excessive restriction. - Continue phentermine 18.75 mg, one tablet in the morning - Track calorie intake using My Net Diary app - Follow a 1000 calorie meal plan with 80-90 grams of protein - Consider meal replacement options like Court Distance, Fairlife, or Premier Protein for one meal

## 2024-05-09 ENCOUNTER — Ambulatory Visit (INDEPENDENT_AMBULATORY_CARE_PROVIDER_SITE_OTHER): Admitting: Internal Medicine

## 2024-05-09 ENCOUNTER — Encounter (INDEPENDENT_AMBULATORY_CARE_PROVIDER_SITE_OTHER): Payer: Self-pay | Admitting: Internal Medicine

## 2024-05-09 VITALS — BP 138/80 | HR 70 | Temp 98.1°F | Ht 64.0 in | Wt 154.0 lb

## 2024-05-09 DIAGNOSIS — Z6826 Body mass index (BMI) 26.0-26.9, adult: Secondary | ICD-10-CM

## 2024-05-09 DIAGNOSIS — Z72820 Sleep deprivation: Secondary | ICD-10-CM

## 2024-05-09 DIAGNOSIS — I10 Essential (primary) hypertension: Secondary | ICD-10-CM

## 2024-05-09 DIAGNOSIS — E7800 Pure hypercholesterolemia, unspecified: Secondary | ICD-10-CM | POA: Diagnosis not present

## 2024-05-09 DIAGNOSIS — E559 Vitamin D deficiency, unspecified: Secondary | ICD-10-CM | POA: Diagnosis not present

## 2024-05-09 DIAGNOSIS — E669 Obesity, unspecified: Secondary | ICD-10-CM

## 2024-05-09 MED ORDER — MULTI-VITAMIN/MINERALS PO TABS: 1.0000 | ORAL_TABLET | Freq: Every day | ORAL | 0 refills | Status: AC

## 2024-05-09 MED ORDER — VITAMIN D3 50 MCG (2000 UT) PO CAPS: 2000.0000 [IU] | ORAL_CAPSULE | Freq: Every day | ORAL | 0 refills | Status: AC

## 2024-05-09 MED ORDER — PHENTERMINE HCL 37.5 MG PO TABS: 18.7500 mg | ORAL_TABLET | Freq: Every day | ORAL | 1 refills | Status: AC

## 2024-05-09 NOTE — Progress Notes (Signed)
 Office: [phone]  /  Fax: [phone]  Weight Summary And Biometrics  Vitals Temp: 98.1 F (36.7 C) BP: 138/80 Pulse Rate: 70 SpO2: 97 %   Anthropometric Measurements Height: 5' 4 (1.626 m) Weight: 154 lb (69.9 kg) BMI (Calculated): 26.42 Weight at Last Visit: 157 lb Weight Lost Since Last Visit: 3 lb Weight Gained Since Last Visit: 0 Starting Weight: 199 lb Total Weight Loss (lbs): 45 lb (20.4 kg) Peak Weight: 230 lb   Body Composition  Body Fat %: 36.9 % Fat Mass (lbs): 56.8 lbs Muscle Mass (lbs): 92.4 lbs Total Body Water (lbs): 66.4 lbs Visceral Fat Rating : 8    RMR: 1397  Today's Visit #: 20  Starting Date: 08/03/22   Subjective   Chief Complaint: Obesity  Interval History Discussed the use of AI scribe software for clinical note transcription with the patient, who gave verbal consent to proceed.  History of Present Illness   Lindsey Burnett is a 58 year old female who presents for medical weight management.  She has lost four pounds since her last visit and is adhering to a 1000 calorie nutrition plan about 50-60% of the time, ensuring adequate protein intake and hydration. She denies skipping meals but is not currently engaging in exercise. Her current body weight is 154 pounds, with a body fat percentage of 36%.  She monitors her blood pressure at home, with readings around 120/85 mmHg, and the highest being 128/80 mmHg. She states her blood pressure has been good.  She experiences sleep disturbances, attributing them to stress and hot flashes. She has difficulty falling asleep and staying asleep, often waking up due to hot flashes, which have been occurring for about three weeks. She feels tired upon waking despite feeling well-rested in the past.  She takes a high-dose vitamin D  supplement every other week, prescribed by her previous doctor. Her vitamin D  level was reported as 64, which is high normal.  She is currently taking  phentermine  for weight management and reports good appetite suppression without experiencing jitteriness.  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 She has previously managed to incorporate walking into her routine after work.       Challenges affecting patient progress: Low volume of physical activity due to time and caregiver responsibilities   Pharmacotherapy for weight management: She is currently taking Phentermine  (longterm use, single agent)  with adequate clinical response  and without side effects..   Assessment and Plan   Treatment Plan For Obesity:  Recommended Dietary Goals  Lindsey Burnett is currently in the action stage of change. As such, her goal is to continue weight management plan. She has agreed to: continue current plan  Behavioral Health and Counseling  We discussed the following behavioral modification strategies today: continue to work on maintaining a reduced calorie state, getting the recommended amount of protein, incorporating whole foods, making healthy choices, staying well hydrated and practicing mindfulness when eating..  Additional education and resources provided today: None  Recommended Physical Activity Goals  Lindsey Burnett has been advised to work up to 150 minutes of moderate intensity aerobic activity a week and strengthening exercises 2-3 times per week for cardiovascular health, weight loss maintenance and preservation of muscle mass.   She has agreed to :  Think about enjoyable ways to increase daily physical activity and overcoming barriers to exercise and Increase physical activity in their day and reduce sedentary time (increase NEAT).  Pharmacotherapy  We discussed various medication options to help Lindsey Burnett with her weight loss efforts and we both agreed to : Adequate clinical response to anti-obesity medication, continue current regimen  Medications started December 2025 More than 5% weight loss  achieved No side effects Prescription records consistent with use Heart rate and blood pressure unaffected   Associated Conditions Impacted by Obesity Treatment  Vitamin D  deficiency -     Multi-Vitamin/Minerals; Take 1 tablet by mouth daily.  Dispense: 90 tablet; Refill: 0 -     Vitamin D3; Take 1 capsule (2,000 Units total) by mouth daily.  Dispense: 90 capsule; Refill: 0  Generalized obesity Assessment & Plan: Undergoing medical weight management with a 4-pound weight loss since the last visit. Following a 1000 calorie nutrition plan 50-60% of the time. BMI is 26, body fat percentage is 36%, close to the goal of less than 36%. Phentermine  is used for weight loss maintenance, effective for up to two years, off label use with continued weight loss observed beyond the first 12 months. Concerns about weight regain if phentermine  is discontinued without an exercise plan. - Continue phentermine , [facility] [place]. - Encourage exercise, specifically walking for 30 minutes after work. - Discuss meal prep options to save time for exercise. - Monitor weight and body composition.  Orders: -     Phentermine  HCl; Take 0.5 tablets (18.75 mg total) by mouth daily before breakfast.  Dispense: 15 tablet; Refill: 1 -     Multi-Vitamin/Minerals; Take 1 tablet by mouth daily.  Dispense: 90 tablet; Refill: 0  Essential hypertension  Pure hypercholesterolemia Assessment & Plan: LDL is at goal. Elevated LDL may be secondary to nutrition, genetics and spillover effect from excess adiposity. Recommended LDL goal is <70 to reduce the risk of fatty streaks and the progression to obstructive ASCVD in the future.   Her 10 year risk is: The 10-year ASCVD risk score (Arnett DK, et al., 2019) is: 7%  Lab Results  Component Value Date   CHOL 144 01/03/2024   HDL 45 01/03/2024   LDLCALC 86 01/03/2024   TRIG 64 01/03/2024   CHOLHDL 2.7 03/30/2022    Continue weight loss therapy, losing  10% or more of body weight may improve condition. Also advised to reduce saturated fats in diet to less than 10% of daily calories.        Poor sleep     Assessment and Plan    Obesity   Sleep Disturbance Reports difficulty sleeping due to stress and hot flashes, affecting daily functioning and quality of life. Concerns about hormone therapy due to family history of stroke. Non-hormonal treatments are available for hot flashes and sleep disturbance. - Schedule a targeted visit with primary care provider to discuss non-hormonal treatment options for hot flashes and sleep disturbance. - Sleep problem precedes use of phentermine  she does not feel that medication is exacerbating this.  Hypertension Blood pressure is well-controlled at home with readings around 120/85 mmHg.  Continue amlodipine  and benazepril .  Vitamin D  Supplementation Vitamin D  level is 64, high normal. Currently taking high-dose vitamin D  every other week, not intended for chronic use due to risk of nephrolithiasis and hypercalcemia. - Stop high-dose vitamin D  supplementation. - Start daily multivitamin with minerals and vitamin D3 2000 international   Obesity / weight Maintenance Transitioning into a weight maintenance phase, requires ongoing monitoring of weight and body composition. Achieved 32% total body weight loss, comparable to gastric bypass.  Continue prescriptive nutritional approach inclusive of antiobesity  medications.  Continue phentermine  at current dose see obesity pharmacotherapy - Schedule follow-up appointment in 8 weeks. - Monitor blood pressure and heart rate at follow-up to continue phentermine  prescription.        Objective   Physical Exam:  Blood pressure 138/80, pulse 70, temperature 98.1 F (36.7 C), height 5' 4 (1.626 m), weight 154 lb (69.9 kg), SpO2 97%. Body mass index is 26.43 kg/m.  General: She is overweight, cooperative, alert, well developed, and in no acute  distress. PSYCH: Has normal mood, affect and thought process.   HEENT: EOMI, sclerae are anicteric. Lungs: Normal breathing effort, no conversational dyspnea. Extremities: No edema.  Neurologic: No gross sensory or motor deficits. No tremors or fasciculations noted.    Diagnostic Data Reviewed:  BMET    Component Value Date/Time   NA 142 01/03/2024 0808   K 4.4 01/03/2024 0808   CL 105 01/03/2024 0808   CO2 26 01/03/2024 0808   GLUCOSE 92 01/03/2024 0808   BUN 11 01/03/2024 0808   CREATININE 0.69 01/03/2024 0808   CALCIUM  9.2 01/03/2024 0808   GFRNONAA 91 08/22/2020 1341   GFRAA 105 08/22/2020 1341   Lab Results  Component Value Date   HGBA1C 5.3 01/03/2024   HGBA1C 5.4 10/17/2019   Lab Results  Component Value Date   INSULIN  7.6 01/03/2024   INSULIN  7.7 08/03/2022   Lab Results  Component Value Date   TSH 1.200 03/30/2022   CBC    Component Value Date/Time   WBC 3.2 (L) 01/03/2024 0808   RBC 4.25 01/03/2024 0808   HGB 12.0 01/03/2024 0808   HCT 37.9 01/03/2024 0808   PLT 244 01/03/2024 0808   MCV 89 01/03/2024 0808   MCH 28.2 01/03/2024 0808   MCHC 31.7 01/03/2024 0808   RDW 13.2 01/03/2024 0808   Iron Studies No results found for: IRON, TIBC, FERRITIN, IRONPCTSAT Lipid Panel     Component Value Date/Time   CHOL 144 01/03/2024 0808   TRIG 64 01/03/2024 0808   HDL 45 01/03/2024 0808   CHOLHDL 2.7 03/30/2022 1700   LDLCALC 86 01/03/2024 0808   Hepatic Function Panel     Component Value Date/Time   PROT 7.6 01/03/2024 0808   ALBUMIN 4.4 01/03/2024 0808   AST 22 01/03/2024 0808   ALT 16 01/03/2024 0808   ALKPHOS 78 01/03/2024 0808   BILITOT 0.6 01/03/2024 0808      Component Value Date/Time   TSH 1.200 03/30/2022 1700   Nutritional Lab Results  Component Value Date   VD25OH 64.6 01/03/2024   VD25OH 69.2 01/03/2023   VD25OH 17.1 (L) 08/03/2022    Medications: Outpatient Encounter Medications as of 05/09/2024  Medication Sig    amLODipine -benazepril  (LOTREL) 5-10 MG capsule Take 1 capsule by mouth daily.   aspirin  EC 81 MG tablet Take 81 mg by mouth daily. Swallow whole.   atorvastatin  (LIPITOR) 20 MG tablet Take 20 mg by mouth daily.   Cholecalciferol (VITAMIN D3) 50 MCG (2000 UT) capsule Take 1 capsule (2,000 Units total) by mouth daily.   Multiple Vitamins-Minerals (MULTIVITAMIN WITH MINERALS) tablet Take 1 tablet by mouth daily.   naproxen (NAPROSYN) 375 MG tablet Take 375 mg by mouth 2 (two) times daily as needed.   triamcinolone  0.1%-Eucerin equivalent 1:1 cream mixture Apply topically 3 (three) times daily as needed.   Vitamin D , Ergocalciferol , (DRISDOL ) 1.25 MG (50000 UNIT) CAPS capsule Take 50,000 Units by mouth once a week.   [DISCONTINUED] phentermine  (ADIPEX-P ) 37.5 MG tablet Take  0.5 tablets (18.75 mg total) by mouth daily before breakfast.   phentermine  (ADIPEX-P ) 37.5 MG tablet Take 0.5 tablets (18.75 mg total) by mouth daily before breakfast.   No facility-administered encounter medications on file as of 05/09/2024.     Follow-Up   No follow-ups on file.Aaron Aas She was informed of the importance of frequent follow up visits to maximize her success with intensive lifestyle modifications for her multiple health conditions.  Attestation Statement   Reviewed by clinician on day of visit: allergies, medications, problem list, medical history, surgical history, family history, social history, and previous encounter notes.     Ladd Picker, MD

## 2024-05-09 NOTE — Assessment & Plan Note (Signed)
 Undergoing medical weight management with a 4-pound weight loss since the last visit. Following a 1000 calorie nutrition plan 50-60% of the time. BMI is 26, body fat percentage is 36%, close to the goal of less than 36%. Phentermine  is used for weight loss maintenance, effective for up to two years, off label use with continued weight loss observed beyond the first 12 months. Concerns about weight regain if phentermine  is discontinued without an exercise plan. - Continue phentermine , [facility] [place]. - Encourage exercise, specifically walking for 30 minutes after work. - Discuss meal prep options to save time for exercise. - Monitor weight and body composition.

## 2024-05-09 NOTE — Assessment & Plan Note (Signed)
 LDL is at goal. Elevated LDL may be secondary to nutrition, genetics and spillover effect from excess adiposity. Recommended LDL goal is <70 to reduce the risk of fatty streaks and the progression to obstructive ASCVD in the future.   Her 10 year risk is: The 10-year ASCVD risk score (Arnett DK, et al., 2019) is: 7%  Lab Results  Component Value Date   CHOL 144 01/03/2024   HDL 45 01/03/2024   LDLCALC 86 01/03/2024   TRIG 64 01/03/2024   CHOLHDL 2.7 03/30/2022    Continue weight loss therapy, losing 10% or more of body weight may improve condition. Also advised to reduce saturated fats in diet to less than 10% of daily calories.

## 2024-06-05 ENCOUNTER — Ambulatory Visit (INDEPENDENT_AMBULATORY_CARE_PROVIDER_SITE_OTHER): Admitting: Internal Medicine

## 2024-06-28 ENCOUNTER — Ambulatory Visit (INDEPENDENT_AMBULATORY_CARE_PROVIDER_SITE_OTHER): Admitting: Internal Medicine

## 2024-07-17 ENCOUNTER — Encounter: Payer: Self-pay | Admitting: Internal Medicine

## 2024-07-17 DIAGNOSIS — Z1231 Encounter for screening mammogram for malignant neoplasm of breast: Secondary | ICD-10-CM

## 2024-09-24 ENCOUNTER — Ambulatory Visit: Payer: Self-pay

## 2024-09-24 NOTE — Telephone Encounter (Signed)
 Not a patient at this office, [facility] - sending message back to sender.

## 2024-09-24 NOTE — Telephone Encounter (Signed)
 FYI Only or Action Required?: FYI only for provider: appointment scheduled on 11/4- wanting to TOC to [facility].  Patient was last seen in primary care on 05/09/2024 by Francyne Romano, MD.  Called Nurse Triage reporting Urinary Frequency.  Symptoms began several days ago.  Interventions attempted: Rest, hydration, or home remedies.  Symptoms are: gradually worsening.  Triage Disposition: See Physician Within 24 Hours  Patient/caregiver understands and will follow disposition?: Yes  Copied from CRM #[id number]. Topic: Clinical - Red Word Triage >> Sep 24, 2024  3:12 PM Ivette P wrote: [facility] that prompted transfer to Nurse Triage: smell, burning, and frequency   UTi Reason for Disposition  All other patients with painful urination  (Exception: [1] EITHER frequency or urgency AND [2] has on-call doctor.)  Answer Assessment - Initial Assessment Questions Frequent UTI's -- Burning pain with urination, increased frequency. Still voiding a good amount when she does. Has hx of prolapsed vagina and frequent UTIs Wants to [facility] to [facility] to female provider. ED/UC precautions given and understood.  1. SEVERITY: How bad is the pain?  (e.g., Scale 1-10; mild, moderate, or severe)     5/10 2. FREQUENCY: How many times have you had painful urination today?      3x and hour 3. PATTERN: Is pain present every time you urinate or just sometimes?      Yes present 4. ONSET: When did the painful urination start?      Friday 5. FEVER: Do you have a fever? If Yes, ask: What is your temperature, how was it measured, and when did it start?     Denies  6. PAST UTI: Have you had a urine infection before? If Yes, ask: When was the last time? and What happened that time?      Last one in July 7. CAUSE: What do you think is causing the painful urination?  (e.g., UTI, scratch, Herpes sore)     UTI 8. OTHER SYMPTOMS: Do you have any other symptoms? (e.g., blood in urine, flank pain,  genital sores, urgency, vaginal discharge)     Slight back/flank pain  Protocols used: Urination Pain - Female-A-AH

## 2024-09-25 ENCOUNTER — Ambulatory Visit: Payer: Self-pay
